# Patient Record
Sex: Female | Born: 1995
Health system: Southern US, Community
[De-identification: ages and names within clinical notes are randomized; demographics above are authoritative.]

## PROBLEM LIST (undated history)

## (undated) DIAGNOSIS — D689 Coagulation defect, unspecified: Secondary | ICD-10-CM

## (undated) DIAGNOSIS — R760 Raised antibody titer: Secondary | ICD-10-CM

## (undated) DIAGNOSIS — F419 Anxiety disorder, unspecified: Secondary | ICD-10-CM

## (undated) DIAGNOSIS — K8020 Calculus of gallbladder without cholecystitis without obstruction: Secondary | ICD-10-CM

## (undated) DIAGNOSIS — I2699 Other pulmonary embolism without acute cor pulmonale: Secondary | ICD-10-CM

## (undated) DIAGNOSIS — D6861 Antiphospholipid syndrome: Secondary | ICD-10-CM

## (undated) DIAGNOSIS — J45909 Unspecified asthma, uncomplicated: Secondary | ICD-10-CM

## (undated) HISTORY — DX: Anxiety disorder, unspecified: F41.9

## (undated) HISTORY — DX: Unspecified asthma, uncomplicated: J45.909

## (undated) HISTORY — PX: WISDOM TOOTH EXTRACTION: SHX21

---

## 2013-09-06 ENCOUNTER — Other Ambulatory Visit: Payer: Self-pay | Admitting: Advanced Practice Midwife

## 2013-09-06 DIAGNOSIS — Z30431 Encounter for routine checking of intrauterine contraceptive device: Secondary | ICD-10-CM

## 2013-09-08 ENCOUNTER — Ambulatory Visit (HOSPITAL_BASED_OUTPATIENT_CLINIC_OR_DEPARTMENT_OTHER): Payer: Self-pay

## 2015-08-26 DIAGNOSIS — R760 Raised antibody titer: Secondary | ICD-10-CM | POA: Insufficient documentation

## 2018-11-08 DIAGNOSIS — I2699 Other pulmonary embolism without acute cor pulmonale: Secondary | ICD-10-CM | POA: Insufficient documentation

## 2019-07-13 DIAGNOSIS — R519 Headache, unspecified: Secondary | ICD-10-CM | POA: Insufficient documentation

## 2019-11-06 ENCOUNTER — Encounter (HOSPITAL_COMMUNITY): Payer: Self-pay

## 2019-11-06 ENCOUNTER — Other Ambulatory Visit: Payer: Self-pay

## 2019-11-06 ENCOUNTER — Emergency Department (HOSPITAL_COMMUNITY)
Admission: EM | Admit: 2019-11-06 | Discharge: 2019-11-06 | Disposition: A | Discharge status: Home / Self Care | Payer: Medicaid Other | Attending: Emergency Medicine | Admitting: Emergency Medicine

## 2019-11-06 DIAGNOSIS — R112 Nausea with vomiting, unspecified: Secondary | ICD-10-CM | POA: Insufficient documentation

## 2019-11-06 DIAGNOSIS — R1011 Right upper quadrant pain: Secondary | ICD-10-CM | POA: Diagnosis not present

## 2019-11-06 DIAGNOSIS — R1013 Epigastric pain: Secondary | ICD-10-CM | POA: Diagnosis present

## 2019-11-06 DIAGNOSIS — K8050 Calculus of bile duct without cholangitis or cholecystitis without obstruction: Secondary | ICD-10-CM | POA: Diagnosis not present

## 2019-11-06 HISTORY — DX: Coagulation defect, unspecified: D68.9

## 2019-11-06 LAB — CBC
HCT: 42.5 % (ref 36.0–46.0)
Hemoglobin: 12.9 g/dL (ref 12.0–15.0)
MCH: 23 pg — ABNORMAL LOW (ref 26.0–34.0)
MCHC: 30.4 g/dL (ref 30.0–36.0)
MCV: 75.9 fL — ABNORMAL LOW (ref 80.0–100.0)
Platelets: 292 10*3/uL (ref 150–400)
RBC: 5.6 MIL/uL — ABNORMAL HIGH (ref 3.87–5.11)
RDW: 16.4 % — ABNORMAL HIGH (ref 11.5–15.5)
WBC: 8.5 10*3/uL (ref 4.0–10.5)
nRBC: 0 % (ref 0.0–0.2)

## 2019-11-06 LAB — BASIC METABOLIC PANEL
Anion gap: 9 (ref 5–15)
BUN: 15 mg/dL (ref 6–20)
CO2: 24 mmol/L (ref 22–32)
Calcium: 9.1 mg/dL (ref 8.9–10.3)
Chloride: 102 mmol/L (ref 98–111)
Creatinine, Ser: 0.81 mg/dL (ref 0.44–1.00)
GFR calc Af Amer: >60 mL/min (ref >60)
GFR calc non Af Amer: >60 mL/min (ref >60)
Glucose, Bld: 98 mg/dL (ref 70–99)
Potassium: 5 mmol/L (ref 3.5–5.1)
Sodium: 135 mmol/L (ref 135–145)

## 2019-11-06 LAB — HEPATIC FUNCTION PANEL
ALT: 21 U/L (ref 0–44)
AST: 40 U/L (ref 15–41)
Albumin: 4 g/dL (ref 3.5–5.0)
Alkaline Phosphatase: 63 U/L (ref 38–126)
Bilirubin, Direct: 0.5 mg/dL — ABNORMAL HIGH (ref 0.0–0.2)
Indirect Bilirubin: 0.5 mg/dL (ref 0.3–0.9)
Total Bilirubin: 1 mg/dL (ref 0.3–1.2)
Total Protein: 7.2 g/dL (ref 6.5–8.1)

## 2019-11-06 LAB — PROTIME-INR
INR: 1.1 (ref 0.8–1.2)
Prothrombin Time: 13.9 seconds (ref 11.4–15.2)

## 2019-11-06 LAB — I-STAT BETA HCG BLOOD, ED (MC, WL, AP ONLY): I-stat hCG, quantitative: <5 m[IU]/mL (ref <5)

## 2019-11-06 LAB — TROPONIN I (HIGH SENSITIVITY): Troponin I (High Sensitivity): <2 ng/L (ref <18)

## 2019-11-06 LAB — LIPASE, BLOOD: Lipase: 41 U/L (ref 11–51)

## 2019-11-06 MED ORDER — HYDROCODONE-ACETAMINOPHEN 5-325 MG PO TABS: 1.0000 | ORAL_TABLET | ORAL | 0 refills | Status: AC | PRN

## 2019-11-06 MED ORDER — ACETAMINOPHEN 500 MG PO TABS
1000.0000 mg | ORAL_TABLET | Freq: Once | ORAL | Status: AC
  Administered 2019-11-06: 06:00:00 1000 mg via ORAL
  Filled 2019-11-06: qty 2

## 2019-11-06 MED ORDER — ONDANSETRON 4 MG PO TBDP: ORAL_TABLET | ORAL | 0 refills | Status: DC

## 2019-11-06 NOTE — ED Provider Notes (Signed)
[facility] EMERGENCY DEPARTMENT Provider Note   CSN: [id number] Arrival date & time: 11/06/19  0346     History Chief Complaint  Patient presents with  . Abdominal Pain    Alicia Mendez is a 24 y.o. female.  Patient with history of clotting disorder, pulmonary embolism, on chronic Coumadin compliant presents with epigastric pain and right upper quadrant pain that radiates to the back.  No history of similar.  Last meal was cookout yesterday.  Patient does not recall normally getting pain after fatty meals.  No known gallbladder problems.  No fevers chills.  Patient did have vomiting.  No blood in the stools or ulcer history.        Past Medical History:  Diagnosis Date  . Clotting disorder (HCC)     There are no problems to display for this patient.   History reviewed. No pertinent surgical history.   OB History   No obstetric history on file.     History reviewed. No pertinent family history.  Social History   Tobacco Use  . Smoking status: Never Smoker  . Smokeless tobacco: Never Used  Substance Use Topics  . Alcohol use: Never  . Drug use: Never    Home Medications Prior to Admission medications   Medication Sig Start Date End Date Taking? Authorizing Provider  HYDROcodone-acetaminophen (NORCO) 5-325 MG tablet Take 1-2 tablets by mouth every 4 (four) hours as needed for up to 3 days for severe pain. 11/06/19 11/09/19  Blane Ohara, MD  ondansetron (ZOFRAN ODT) 4 MG disintegrating tablet 4mg  ODT q4 hours prn nausea/vomit 11/06/19   01/04/20, MD    Allergies    Patient has no known allergies.  Review of Systems   Review of Systems  Constitutional: Negative for chills and fever.  HENT: Negative for congestion.   Eyes: Negative for visual disturbance.  Respiratory: Negative for shortness of breath.   Cardiovascular: Negative for chest pain.  Gastrointestinal: Positive for abdominal pain, nausea and vomiting. Negative for  diarrhea.  Genitourinary: Negative for dysuria and flank pain.  Musculoskeletal: Negative for back pain, neck pain and neck stiffness.  Skin: Negative for rash.  Neurological: Negative for light-headedness and headaches.    Physical Exam Updated Vital Signs BP 135/84 (BP Location: Right Arm)   Pulse 74   Temp 97.8 F (36.6 C) (Oral)   Resp 17   Ht 5\' 3"  (1.6 m)   Wt 78.5 kg   SpO2 100%   BMI 30.65 kg/m   Physical Exam Vitals and nursing note reviewed.  Constitutional:      Appearance: She is well-developed.  HENT:     Head: Normocephalic and atraumatic.  Eyes:     General:        Right eye: No discharge.        Left eye: No discharge.     Conjunctiva/sclera: Conjunctivae normal.  Neck:     Trachea: No tracheal deviation.  Cardiovascular:     Rate and Rhythm: Normal rate and regular rhythm.  Pulmonary:     Effort: Pulmonary effort is normal.     Breath sounds: Normal breath sounds.  Abdominal:     General: There is no distension.     Palpations: Abdomen is soft.     Tenderness: There is abdominal tenderness in the right upper quadrant. There is no guarding.  Musculoskeletal:     Cervical back: Normal range of motion and neck supple.  Skin:    General: Skin is  warm.     Findings: No rash.  Neurological:     Mental Status: She is alert and oriented to person, place, and time.     ED Results / Procedures / Treatments   Labs (all labs ordered are listed, but only abnormal results are displayed) Labs Reviewed  CBC - Abnormal; Notable for the following components:      Result Value   RBC 5.60 (*)    MCV 75.9 (*)    MCH 23.0 (*)    RDW 16.4 (*)    All other components within normal limits  HEPATIC FUNCTION PANEL - Abnormal; Notable for the following components:   Bilirubin, Direct 0.5 (*)    All other components within normal limits  BASIC METABOLIC PANEL  PROTIME-INR  LIPASE, BLOOD  I-STAT BETA HCG BLOOD, ED ([facility], [facility], [facility] ONLY)  TROPONIN I (HIGH  SENSITIVITY)  TROPONIN I (HIGH SENSITIVITY)    EKG EKG Interpretation  Date/Time:  Monday November 06 2019 03:52:48 EST Ventricular Rate:  74 PR Interval:  130 QRS Duration: 78 QT Interval:  380 QTC Calculation: 421 R Axis:   79 Text Interpretation: Normal sinus rhythm with sinus arrhythmia Confirmed by Randal Buba, April ([id number]) on 11/06/2019 4:18:16 AM Also confirmed by Elnora Morrison [phone])  on 11/06/2019 5:23:42 AM   Radiology No results found.  Procedures Ultrasound ED Abd  Date/Time: 11/06/2019 6:11 AM Performed by: Elnora Morrison, MD Authorized by: Elnora Morrison, MD   Procedure details:    Indications: abdominal pain     Assessment for:  Gallstones   Hepatobiliary:  Visualized   Images: archived    Hepatobiliary findings:    Gallbladder wall:  Normal   Gallbladder stones: identified     Polyps: not identified     Sonographic Murphy's sign: negative     (including critical care time)  Medications Ordered in ED Medications  acetaminophen (TYLENOL) tablet 1,000 mg (1,000 mg Oral Given 11/06/19 0606)    ED Course  I have reviewed the triage vital signs and the nursing notes.  Pertinent labs & imaging results that were available during my care of the patient were reviewed by me and considered in my medical decision making (see chart for details).    MDM Rules/Calculators/A&P                      Patient presents with clinical concern for gallstones versus reflux.  Bedside ultrasound performed showing multiple shadowing gallstones and no signs of cholecystitis.  Patient is well-appearing currently, pain is mild, no active vomiting or fevers.  Discussed importance of follow-up with general surgery this week, avoiding fatty foods and reasons to return.  Blood work reviewed normal white blood cell count, Blood work reviewed normal liver function, normal lipase.  Patient's pain controlled and stable for outpatient follow-up with general surgery.  Final Clinical  Impression(s) / ED Diagnoses Final diagnoses:  Biliary colic    Rx / DC Orders ED Discharge Orders         Ordered    HYDROcodone-acetaminophen (NORCO) 5-325 MG tablet  Every 4 hours PRN     11/06/19 0605    ondansetron (ZOFRAN ODT) 4 MG disintegrating tablet     11/06/19 1856           Elnora Morrison, MD 11/06/19 [phone]

## 2019-11-06 NOTE — ED Notes (Signed)
ED Provider at bedside. 

## 2019-11-06 NOTE — ED Triage Notes (Signed)
Pt c/o epigastric pain that radiates to back. Also c/o nausea and vomiting.

## 2019-11-06 NOTE — Discharge Instructions (Signed)
Avoid fatty foods.  Use Tylenol every 4 hours as needed for mild pain.  For severe pain you can take Norco however it can make you sleepy and is addictive so be careful. If you develop uncontrolled pain, persistent vomiting, fevers or new concerns return to the emergency room.  If you do overall well follow-up closely with general surgery to discuss treatment options. Use Zofran as needed for vomiting. Your INR level is not therapeutic.  Please call your physician to clarify what level they want you out typically is between 2 and 3.  It is important for you to take your blood thinning medication at prevent blood clots.  The surgeon will talk to you about when to hold medication for surgery.

## 2019-11-19 ENCOUNTER — Encounter: Payer: Self-pay | Admitting: General Surgery

## 2019-11-19 ENCOUNTER — Other Ambulatory Visit: Payer: Self-pay | Admitting: General Surgery

## 2019-11-20 ENCOUNTER — Other Ambulatory Visit: Payer: Self-pay | Admitting: General Surgery

## 2019-11-23 ENCOUNTER — Other Ambulatory Visit: Payer: Self-pay | Admitting: General Surgery

## 2019-11-23 DIAGNOSIS — R1013 Epigastric pain: Secondary | ICD-10-CM

## 2019-11-29 ENCOUNTER — Ambulatory Visit
Admission: RE | Admit: 2019-11-29 | Discharge: 2019-11-29 | Disposition: A | Discharge status: Home / Self Care | Payer: Medicaid Other | Source: Ambulatory Visit | Attending: General Surgery | Admitting: General Surgery

## 2019-11-29 DIAGNOSIS — R1013 Epigastric pain: Secondary | ICD-10-CM

## 2019-12-07 ENCOUNTER — Encounter (HOSPITAL_COMMUNITY): Payer: Self-pay | Admitting: Emergency Medicine

## 2019-12-07 ENCOUNTER — Emergency Department (HOSPITAL_COMMUNITY)
Admission: EM | Admit: 2019-12-07 | Discharge: 2019-12-07 | Disposition: A | Discharge status: Home / Self Care | Payer: Medicaid Other | Attending: Emergency Medicine | Admitting: Emergency Medicine

## 2019-12-07 ENCOUNTER — Emergency Department (HOSPITAL_COMMUNITY): Payer: Medicaid Other

## 2019-12-07 ENCOUNTER — Other Ambulatory Visit: Payer: Self-pay

## 2019-12-07 DIAGNOSIS — R112 Nausea with vomiting, unspecified: Secondary | ICD-10-CM | POA: Diagnosis not present

## 2019-12-07 DIAGNOSIS — Z20822 Contact with and (suspected) exposure to covid-19: Secondary | ICD-10-CM | POA: Diagnosis not present

## 2019-12-07 DIAGNOSIS — K8020 Calculus of gallbladder without cholecystitis without obstruction: Secondary | ICD-10-CM

## 2019-12-07 DIAGNOSIS — R1011 Right upper quadrant pain: Secondary | ICD-10-CM | POA: Insufficient documentation

## 2019-12-07 HISTORY — DX: Calculus of gallbladder without cholecystitis without obstruction: K80.20

## 2019-12-07 LAB — RESPIRATORY PANEL BY RT PCR (FLU A&B, COVID)
Influenza A by PCR: NEGATIVE
Influenza B by PCR: NEGATIVE
SARS Coronavirus 2 by RT PCR: NEGATIVE

## 2019-12-07 LAB — LIPASE, BLOOD: Lipase: 55 U/L — ABNORMAL HIGH (ref 11–51)

## 2019-12-07 LAB — PROTIME-INR
INR: 2.1 — ABNORMAL HIGH (ref 0.8–1.2)
Prothrombin Time: 23.9 seconds — ABNORMAL HIGH (ref 11.4–15.2)

## 2019-12-07 LAB — COMPREHENSIVE METABOLIC PANEL
ALT: 18 U/L (ref 0–44)
AST: 18 U/L (ref 15–41)
Albumin: 4.2 g/dL (ref 3.5–5.0)
Alkaline Phosphatase: 68 U/L (ref 38–126)
Anion gap: 11 (ref 5–15)
BUN: 20 mg/dL (ref 6–20)
CO2: 26 mmol/L (ref 22–32)
Calcium: 9.2 mg/dL (ref 8.9–10.3)
Chloride: 100 mmol/L (ref 98–111)
Creatinine, Ser: 0.73 mg/dL (ref 0.44–1.00)
GFR calc Af Amer: >60 mL/min (ref >60)
GFR calc non Af Amer: >60 mL/min (ref >60)
Glucose, Bld: 101 mg/dL — ABNORMAL HIGH (ref 70–99)
Potassium: 3.7 mmol/L (ref 3.5–5.1)
Sodium: 137 mmol/L (ref 135–145)
Total Bilirubin: 0.3 mg/dL (ref 0.3–1.2)
Total Protein: 7.7 g/dL (ref 6.5–8.1)

## 2019-12-07 LAB — URINALYSIS, ROUTINE W REFLEX MICROSCOPIC
Bacteria, UA: NONE SEEN
Bilirubin Urine: NEGATIVE
Glucose, UA: NEGATIVE mg/dL
Ketones, ur: NEGATIVE mg/dL
Nitrite: NEGATIVE
Protein, ur: NEGATIVE mg/dL
Specific Gravity, Urine: 1.023 (ref 1.005–1.030)
pH: 8 (ref 5.0–8.0)

## 2019-12-07 LAB — CBC
HCT: 42.6 % (ref 36.0–46.0)
Hemoglobin: 13 g/dL (ref 12.0–15.0)
MCH: 23.3 pg — ABNORMAL LOW (ref 26.0–34.0)
MCHC: 30.5 g/dL (ref 30.0–36.0)
MCV: 76.2 fL — ABNORMAL LOW (ref 80.0–100.0)
Platelets: 295 10*3/uL (ref 150–400)
RBC: 5.59 MIL/uL — ABNORMAL HIGH (ref 3.87–5.11)
RDW: 16.7 % — ABNORMAL HIGH (ref 11.5–15.5)
WBC: 9.8 10*3/uL (ref 4.0–10.5)
nRBC: 0 % (ref 0.0–0.2)

## 2019-12-07 LAB — I-STAT BETA HCG BLOOD, ED (MC, WL, AP ONLY): I-stat hCG, quantitative: <5 m[IU]/mL (ref <5)

## 2019-12-07 MED ORDER — ONDANSETRON HCL 4 MG/2ML IJ SOLN
4.0000 mg | Freq: Once | INTRAMUSCULAR | Status: AC
  Administered 2019-12-07: 4 mg via INTRAVENOUS
  Filled 2019-12-07: qty 2

## 2019-12-07 MED ORDER — AMOXICILLIN-POT CLAVULANATE 875-125 MG PO TABS: 1.0000 | ORAL_TABLET | Freq: Two times a day (BID) | ORAL | 0 refills | Status: DC

## 2019-12-07 MED ORDER — SODIUM CHLORIDE 0.9% FLUSH: 3.0000 mL | Freq: Once | INTRAVENOUS | Status: DC

## 2019-12-07 MED ORDER — FENTANYL CITRATE (PF) 100 MCG/2ML IJ SOLN
50.0000 ug | Freq: Once | INTRAMUSCULAR | Status: AC
  Administered 2019-12-07: 50 ug via INTRAVENOUS
  Filled 2019-12-07: qty 2

## 2019-12-07 NOTE — ED Triage Notes (Signed)
Patient reports upper abdominal pain with emesis and diarrhea onset this week , denies fever or chills , patient stated it feels like her gallstone pain .

## 2019-12-07 NOTE — ED Notes (Signed)
The pt has known gallstones  She awakened at 0100am  With pain and vomited once   Her pain is not going away  She has a clotting disorder  She has to be cleared by her oncologist  And she says that her surgery will be posted after this  She takes coumadin

## 2019-12-07 NOTE — Discharge Instructions (Addendum)
Contact a health care provider if: °You think you have had a gallbladder attack. °You have been diagnosed with silent gallstones and you develop abdominal pain or indigestion. °Get help right away if: °You have pain from a gallbladder attack that lasts for more than 2 hours. °You have abdominal pain that lasts for more than 5 hours. °You have a fever or chills. °You have persistent nausea and vomiting. °You develop jaundice. °You have dark urine or light-colored stools. °

## 2019-12-07 NOTE — Consult Note (Signed)
Reason for Consult: abdominal pain Referring Physician: April Palumbo  Alicia Mendez is an 24 y.o. female.  HPI: 24 yo female with 1 month of intermittent abdominal pain. She woke up from sleep with pain and vomiting. She has intermittent diarrhea. She denies fevers. She was set to see her hematologist tomorrow  Past Medical History:  Diagnosis Date  . Clotting disorder (HCC)   . Gallstones     History reviewed. No pertinent surgical history.  No family history on file.  Social History:  reports that she has never smoked. She has never used smokeless tobacco. She reports that she does not drink alcohol or use drugs.  Allergies: No Known Allergies  Medications: I have reviewed the patient's current medications.  Results for orders placed or performed during the hospital encounter of 12/07/19 (from the past 48 hour(s))  Lipase, blood     Status: Abnormal   Collection Time: 12/07/19  2:57 AM  Result Value Ref Range   Lipase 55 (H) 11 - 51 U/L    Comment: Performed at [facility], [address], [place], [place] [address]  Comprehensive metabolic panel     Status: Abnormal   Collection Time: 12/07/19  2:57 AM  Result Value Ref Range   Sodium 137 135 - 145 mmol/L   Potassium 3.7 3.5 - 5.1 mmol/L   Chloride 100 98 - 111 mmol/L   CO2 26 22 - 32 mmol/L   Glucose, Bld 101 (H) 70 - 99 mg/dL   BUN 20 6 - 20 mg/dL   Creatinine, Ser 2.37 0.44 - 1.00 mg/dL   Calcium 9.2 8.9 - 62.8 mg/dL   Total Protein 7.7 6.5 - 8.1 g/dL   Albumin 4.2 3.5 - 5.0 g/dL   AST 18 15 - 41 U/L   ALT 18 0 - 44 U/L   Alkaline Phosphatase 68 38 - 126 U/L   Total Bilirubin 0.3 0.3 - 1.2 mg/dL   GFR calc non Af Amer >60 >60 mL/min   GFR calc Af Amer >60 >60 mL/min   Anion gap 11 5 - 15    Comment: Performed at [facility], [address], [place], [place] [address]  CBC     Status: Abnormal   Collection Time: 12/07/19  2:57 AM  Result Value Ref Range   WBC 9.8 4.0 - 10.5 K/uL   RBC  5.59 (H) 3.87 - 5.11 MIL/uL   Hemoglobin 13.0 12.0 - 15.0 g/dL   HCT 61.6 07.3 - 71.0 %   MCV 76.2 (L) 80.0 - 100.0 fL   MCH 23.3 (L) 26.0 - 34.0 pg   MCHC 30.5 30.0 - 36.0 g/dL   RDW 62.6 (H) 94.8 - 54.6 %   Platelets 295 150 - 400 K/uL   nRBC 0.0 0.0 - 0.2 %    Comment: Performed at [facility], [address] [place], [place] [address]  Urinalysis, Routine w reflex microscopic     Status: Abnormal   Collection Time: 12/07/19  2:59 AM  Result Value Ref Range   Color, Urine YELLOW YELLOW   APPearance HAZY (A) CLEAR   Specific Gravity, Urine 1.023 1.005 - 1.030   pH 8.0 5.0 - 8.0   Glucose, UA NEGATIVE NEGATIVE mg/dL   Hgb urine dipstick MODERATE (A) NEGATIVE   Bilirubin Urine NEGATIVE NEGATIVE   Ketones, ur NEGATIVE NEGATIVE mg/dL   Protein, ur NEGATIVE NEGATIVE mg/dL   Nitrite NEGATIVE NEGATIVE   Leukocytes,Ua LARGE (A)  NEGATIVE   RBC / HPF 0-5 0 - 5 RBC/hpf   WBC, UA 6-10 0 - 5 WBC/hpf   Bacteria, UA NONE SEEN NONE SEEN   Squamous Epithelial / LPF 11-20 0 - 5   Mucus PRESENT     Comment: Performed at [facility], [address], [place], [place] [address]  I-Stat beta hCG blood, ED     Status: None   Collection Time: 12/07/19  3:03 AM  Result Value Ref Range   I-stat hCG, quantitative <5.0 <5 mIU/mL   Comment 3            Comment:   GEST. AGE      CONC.  (mIU/mL)   <=1 WEEK        5 - 50     2 WEEKS       50 - 500     3 WEEKS       100 - 10,000     4 WEEKS     1,000 - 30,000        FEMALE AND NON-PREGNANT FEMALE:     LESS THAN 5 mIU/mL   Respiratory Panel by RT PCR (Flu A&B, Covid) - Nasopharyngeal Swab     Status: None   Collection Time: 12/07/19  4:15 AM   Specimen: Nasopharyngeal Swab  Result Value Ref Range   SARS Coronavirus 2 by RT PCR NEGATIVE NEGATIVE    Comment: (NOTE) SARS-CoV-2 target nucleic acids are NOT DETECTED. The SARS-CoV-2 RNA is generally detectable in upper respiratoy specimens during the acute phase of infection. The  lowest concentration of SARS-CoV-2 viral copies this assay can detect is 131 copies/mL. A negative result does not preclude SARS-Cov-2 infection and should not be used as the sole basis for treatment or other patient management decisions. A negative result may occur with  improper specimen collection/handling, submission of specimen other than nasopharyngeal swab, presence of viral mutation(s) within the areas targeted by this assay, and inadequate number of viral copies (<131 copies/mL). A negative result must be combined with clinical observations, patient history, and epidemiological information. The expected result is Negative. Fact Sheet for Patients:  https://www.moore.com/ Fact Sheet for Healthcare Providers:  https://www.young.biz/ This test is not yet ap proved or cleared by the [place] FDA and  has been authorized for detection and/or diagnosis of SARS-CoV-2 by FDA under an Emergency Use Authorization (EUA). This EUA will remain  in effect (meaning this test can be used) for the duration of the COVID-19 declaration under Section 564(b)(1) of the Act, 21 U.S.C. section 360bbb-3(b)(1), unless the authorization is terminated or revoked sooner.    Influenza A by PCR NEGATIVE NEGATIVE   Influenza B by PCR NEGATIVE NEGATIVE    Comment: (NOTE) The Xpert Xpress SARS-CoV-2/FLU/RSV assay is intended as an aid in  the diagnosis of influenza from Nasopharyngeal swab specimens and  should not be used as a sole basis for treatment. Nasal washings and  aspirates are unacceptable for Xpert Xpress SARS-CoV-2/FLU/RSV  testing. Fact Sheet for Patients: https://www.moore.com/ Fact Sheet for Healthcare Providers: https://www.young.biz/ This test is not yet approved or cleared by the [place] FDA and  has been authorized for detection and/or diagnosis of SARS-CoV-2 by  FDA under an Emergency Use  Authorization (EUA). This EUA will remain  in effect (meaning this test can be used) for the duration of the  Covid-19 declaration under Section 564(b)(1) of the Act, 21  U.S.C. section 360bbb-3(b)(1), unless the authorization is  terminated  or revoked. Performed at [facility], [place] [address], [place], [place] [address]   Protime-INR     Status: Abnormal   Collection Time: 12/07/19  5:41 AM  Result Value Ref Range   Prothrombin Time 23.9 (H) 11.4 - 15.2 seconds   INR 2.1 (H) 0.8 - 1.2    Comment: (NOTE) INR goal varies based on device and disease states. Performed at [facility], [place] [address] [place], [place] [address]     US ABDOMEN LIMITED RUQ  Result Date: 12/07/2019 CLINICAL DATA:  Right upper quadrant pain EXAM: ULTRASOUND ABDOMEN LIMITED RIGHT UPPER QUADRANT COMPARISON:  11/29/2019 FINDINGS: Gallbladder: Multiple layering calcified stones are seen. The largest measuring 6 mm. The gallbladder wall measures 2.9 mm. No sonographic Murphy's or pericholecystic fluid. Common bile duct: Diameter: 3.6 mm Liver: No focal lesion identified. Within normal limits in parenchymal echogenicity. Portal vein is patent on color Doppler imaging with normal direction of blood flow towards the liver. Other: None. IMPRESSION: Cholelithiasis without evidence of cholecystitis. Electronically Signed   By: Prudencio Pair M.D.   On: 12/07/2019 03:51   Review of Systems  Constitutional: Negative for chills and fever.  HENT: Negative for hearing loss.   Eyes: Negative for blurred vision and double vision.  Respiratory: Negative for cough and hemoptysis.   Cardiovascular: Negative for chest pain and palpitations.  Gastrointestinal: Positive for abdominal pain, nausea and vomiting.  Genitourinary: Negative for dysuria and urgency.  Musculoskeletal: Negative for myalgias and neck pain.  Skin: Negative for itching and rash.  Neurological: Negative for dizziness, tingling and headaches.   Endo/Heme/Allergies: Does not bruise/bleed easily.  Psychiatric/Behavioral: Negative for depression and suicidal ideas.   Blood pressure (!) 134/95, pulse 85, temperature (!) 97.5 F (36.4 C), temperature source Oral, resp. rate 16, last menstrual period 11/14/2019, SpO2 100 %. Physical Exam  Vitals reviewed. Constitutional: She is oriented to person, place, and time. She appears well-developed and well-nourished.  HENT:  Head: Normocephalic and atraumatic.  Eyes: Pupils are equal, round, and reactive to light. Conjunctivae and EOM are normal.  Cardiovascular: Normal rate and regular rhythm.  Respiratory: Effort normal and breath sounds normal.  GI: Soft. Bowel sounds are normal. She exhibits no distension. There is no abdominal tenderness.  Musculoskeletal:        General: Normal range of motion.     Cervical back: Normal range of motion and neck supple.  Neurological: She is alert and oriented to person, place, and time.  Skin: Skin is warm and dry.  Psychiatric: She has a normal mood and affect. Her behavior is normal.   Assessment/Plan: 24 yo female with gallstones who had an attack. Labs are normal, INR is 2.1 -patient would prefer to go home today -will send with 1 week of antibiotics  Arta Bruce Maanav Kassabian 12/07/2019, 6:29 AM

## 2019-12-07 NOTE — ED Provider Notes (Signed)
[facility] EMERGENCY DEPARTMENT Provider Note   CSN: [id number] Arrival date & time: 12/07/19  0243     History Chief Complaint  Patient presents with  . Abdominal Pain    Gallstones    Alicia Mendez is a 24 y.o. female.  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  Patient has had recurrent episodes of biliary colic secondary to gallstones.  She states that she woke from sleep at 1 AM with progressively worsening colicky right upper quadrant pain and epigastric pain consistent with her previous episodes of biliary colic.  It has been severe, worsening.  She has had at least one episode of vomiting.  The pain radiates to her right upper back region.  She denies any urinary symptoms.  She has current nausea and severe pain.  She denies urinary symptoms, vaginal symptoms, diarrhea.  HPI     Past Medical History:  Diagnosis Date  . Clotting disorder ([place])   . Gallstones     Patient Active Problem List   Diagnosis Date Noted  . Headache disorder 07/13/2019  . 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 No pertinent surgical history.   OB History   No obstetric history on file.     No family history on file.  Social History   Tobacco Use  . Smoking status: Never Smoker  . Smokeless tobacco: Never Used  Substance Use Topics  . Alcohol use: Never  . Drug use: Never    Home Medications Prior to Admission medications   Medication Sig Start Date End Date Taking? Authorizing Provider  ondansetron (ZOFRAN ODT) 4 MG disintegrating tablet 4mg  ODT q4 hours prn nausea/vomit 11/06/19   Elnora Morrison, MD    Allergies    Patient has no known allergies.  Review of Systems   Review of Systems Ten systems reviewed and are negative for acute change, except as noted in the HPI.   Physical  Exam Updated Vital Signs BP (!) 134/95 (BP Location: Left Arm)   Pulse 85   Temp (!) 97.5 F (36.4 C) (Oral)   Resp 16   LMP 11/14/2019   SpO2 100%   Physical Exam Vitals and nursing note reviewed.  Constitutional:      General: She is not in acute distress.    Appearance: She is well-developed. She is not diaphoretic.  HENT:     Head: Normocephalic and atraumatic.  Eyes:     General: No scleral icterus.    Conjunctiva/sclera: Conjunctivae normal.  Cardiovascular:     Rate and Rhythm: Normal rate and regular rhythm.     Heart sounds: Normal heart sounds. No murmur. No friction rub. No gallop.   Pulmonary:     Effort: Pulmonary effort is normal. No respiratory distress.     Breath sounds: Normal breath sounds.  Abdominal:     General: Bowel sounds are normal. There is no distension.     Palpations: Abdomen is soft. There is no mass.     Tenderness: There is abdominal tenderness in the right upper quadrant. There is guarding. There is no right CVA tenderness or left CVA tenderness. Positive signs include Murphy's sign.  Musculoskeletal:     Cervical back: Normal range of motion.  Skin:    General: Skin is warm and dry.  Neurological:     Mental Status: She is alert and oriented to person, place, and  time.  Psychiatric:        Behavior: Behavior normal.     ED Results / Procedures / Treatments   Labs (all labs ordered are listed, but only abnormal results are displayed) Labs Reviewed  URINALYSIS, ROUTINE W REFLEX MICROSCOPIC - Abnormal; Notable for the following components:      Result Value   APPearance HAZY (*)    Hgb urine dipstick MODERATE (*)    Leukocytes,Ua LARGE (*)    All other components within normal limits  LIPASE, BLOOD  COMPREHENSIVE METABOLIC PANEL  CBC  I-STAT BETA HCG BLOOD, ED ([facility], [facility], [facility] ONLY)    EKG None  Radiology No results found.  Procedures Procedures (including critical care time)  Medications Ordered in ED Medications  sodium  chloride flush (NS) 0.9 % injection 3 mL (has no administration in time range)    ED Course  I have reviewed the triage vital signs and the nursing notes.  Pertinent labs & imaging results that were available during my care of the patient were reviewed by me and considered in my medical decision making (see chart for details).  Clinical Course as of Dec 06 721  Thu Dec 07, 2019  0534 Patient on Coumadin- INR pending    [AH]    Clinical Course User Index [AH] Arthor Captain, PA-C   MDM Rules/Calculators/A&P                     24 year old female here with known gallstones and history of biliary colic.The emergent DDX for RUQ pain includes but is not limited to Glabladder disease, PUD, Acute Hepatitis, Pancreatitis, pyelonephritis, Pneumonia, Lower lobe PE/Infarct, Kidney stone, GERD, retrocecal appendicitis, Fitz-Hugh-Curtis syndrome, AAA, MI, Zoster. I reviewed the patient's labs which show therapeutic INR, negative pregnancy test, negative Covid and flu panel, urine appears contaminated.  Her CMP shows a mildly elevated glucose in setting of pain likely reactive.  CBC without significant abnormality.  Lipase is just above normal and likely of insignificant value.  Patient continued to have pain although improved after pain medications.  She was evaluated in the ER by Dr. Sheliah Hatch however patient declined surgery at this time she needs a line of childcare and will be discharged on Augmentin per Dr. Sheliah Hatch.  Discussed return precautions  Final Clinical Impression(s) / ED Diagnoses Final diagnoses:  RUQ abdominal pain    Rx / DC Orders ED Discharge Orders    None       Arthor Captain, PA-C 12/07/19 0726    Palumbo, April, MD 12/07/19 2309

## 2020-04-25 DIAGNOSIS — Z419 Encounter for procedure for purposes other than remedying health state, unspecified: Secondary | ICD-10-CM | POA: Diagnosis not present

## 2020-05-03 DIAGNOSIS — I2699 Other pulmonary embolism without acute cor pulmonale: Secondary | ICD-10-CM | POA: Diagnosis not present

## 2020-05-03 DIAGNOSIS — D6861 Antiphospholipid syndrome: Secondary | ICD-10-CM | POA: Diagnosis not present

## 2020-05-10 DIAGNOSIS — D6861 Antiphospholipid syndrome: Secondary | ICD-10-CM | POA: Diagnosis not present

## 2020-05-10 DIAGNOSIS — I2699 Other pulmonary embolism without acute cor pulmonale: Secondary | ICD-10-CM | POA: Diagnosis not present

## 2020-05-16 DIAGNOSIS — Z3202 Encounter for pregnancy test, result negative: Secondary | ICD-10-CM | POA: Diagnosis not present

## 2020-05-16 DIAGNOSIS — N912 Amenorrhea, unspecified: Secondary | ICD-10-CM | POA: Diagnosis not present

## 2020-05-16 DIAGNOSIS — Z975 Presence of (intrauterine) contraceptive device: Secondary | ICD-10-CM | POA: Diagnosis not present

## 2020-05-16 DIAGNOSIS — Z3481 Encounter for supervision of other normal pregnancy, first trimester: Secondary | ICD-10-CM | POA: Diagnosis not present

## 2020-05-20 DIAGNOSIS — I2699 Other pulmonary embolism without acute cor pulmonale: Secondary | ICD-10-CM | POA: Diagnosis not present

## 2020-05-21 DIAGNOSIS — N912 Amenorrhea, unspecified: Secondary | ICD-10-CM | POA: Diagnosis not present

## 2020-05-21 DIAGNOSIS — Z3202 Encounter for pregnancy test, result negative: Secondary | ICD-10-CM | POA: Diagnosis not present

## 2020-05-26 DIAGNOSIS — Z419 Encounter for procedure for purposes other than remedying health state, unspecified: Secondary | ICD-10-CM | POA: Diagnosis not present

## 2020-06-11 DIAGNOSIS — Z30430 Encounter for insertion of intrauterine contraceptive device: Secondary | ICD-10-CM | POA: Diagnosis not present

## 2020-06-11 DIAGNOSIS — N912 Amenorrhea, unspecified: Secondary | ICD-10-CM | POA: Diagnosis not present

## 2020-06-11 DIAGNOSIS — Z3202 Encounter for pregnancy test, result negative: Secondary | ICD-10-CM | POA: Diagnosis not present

## 2020-06-18 DIAGNOSIS — D6859 Other primary thrombophilia: Secondary | ICD-10-CM | POA: Diagnosis not present

## 2020-06-18 DIAGNOSIS — D6861 Antiphospholipid syndrome: Secondary | ICD-10-CM | POA: Diagnosis not present

## 2020-06-18 DIAGNOSIS — I2699 Other pulmonary embolism without acute cor pulmonale: Secondary | ICD-10-CM | POA: Diagnosis not present

## 2020-06-21 ENCOUNTER — Emergency Department (HOSPITAL_COMMUNITY): Payer: Medicaid Other

## 2020-06-21 ENCOUNTER — Emergency Department (HOSPITAL_COMMUNITY)
Admission: EM | Admit: 2020-06-21 | Discharge: 2020-06-22 | Disposition: A | Discharge status: Home / Self Care | Payer: Medicaid Other | Attending: Emergency Medicine | Admitting: Emergency Medicine

## 2020-06-21 ENCOUNTER — Other Ambulatory Visit: Payer: Self-pay

## 2020-06-21 ENCOUNTER — Encounter (HOSPITAL_COMMUNITY): Payer: Self-pay

## 2020-06-21 DIAGNOSIS — Y998 Other external cause status: Secondary | ICD-10-CM | POA: Diagnosis not present

## 2020-06-21 DIAGNOSIS — Z7901 Long term (current) use of anticoagulants: Secondary | ICD-10-CM | POA: Diagnosis not present

## 2020-06-21 DIAGNOSIS — Y9289 Other specified places as the place of occurrence of the external cause: Secondary | ICD-10-CM | POA: Insufficient documentation

## 2020-06-21 DIAGNOSIS — M542 Cervicalgia: Secondary | ICD-10-CM | POA: Insufficient documentation

## 2020-06-21 DIAGNOSIS — M545 Low back pain: Secondary | ICD-10-CM | POA: Diagnosis not present

## 2020-06-21 DIAGNOSIS — Y9389 Activity, other specified: Secondary | ICD-10-CM | POA: Diagnosis not present

## 2020-06-21 DIAGNOSIS — R519 Headache, unspecified: Secondary | ICD-10-CM | POA: Diagnosis not present

## 2020-06-21 DIAGNOSIS — F4310 Post-traumatic stress disorder, unspecified: Secondary | ICD-10-CM | POA: Diagnosis not present

## 2020-06-21 LAB — COMPREHENSIVE METABOLIC PANEL
ALT: 17 U/L (ref 0–44)
AST: 20 U/L (ref 15–41)
Albumin: 4.1 g/dL (ref 3.5–5.0)
Alkaline Phosphatase: 60 U/L (ref 38–126)
Anion gap: 11 (ref 5–15)
BUN: 8 mg/dL (ref 6–20)
CO2: 23 mmol/L (ref 22–32)
Calcium: 9.1 mg/dL (ref 8.9–10.3)
Chloride: 103 mmol/L (ref 98–111)
Creatinine, Ser: 1.09 mg/dL — ABNORMAL HIGH (ref 0.44–1.00)
GFR calc Af Amer: >60 mL/min (ref >60)
GFR calc non Af Amer: >60 mL/min (ref >60)
Glucose, Bld: 101 mg/dL — ABNORMAL HIGH (ref 70–99)
Potassium: 3.9 mmol/L (ref 3.5–5.1)
Sodium: 137 mmol/L (ref 135–145)
Total Bilirubin: 0.4 mg/dL (ref 0.3–1.2)
Total Protein: 7.6 g/dL (ref 6.5–8.1)

## 2020-06-21 LAB — CBC WITH DIFFERENTIAL/PLATELET
Abs Immature Granulocytes: 0.02 10*3/uL (ref 0.00–0.07)
Basophils Absolute: 0 10*3/uL (ref 0.0–0.1)
Basophils Relative: 0 %
Eosinophils Absolute: 0.1 10*3/uL (ref 0.0–0.5)
Eosinophils Relative: 1 %
HCT: 43.4 % (ref 36.0–46.0)
Hemoglobin: 13.6 g/dL (ref 12.0–15.0)
Immature Granulocytes: 0 %
Lymphocytes Relative: 38 %
Lymphs Abs: 3.1 10*3/uL (ref 0.7–4.0)
MCH: 25.2 pg — ABNORMAL LOW (ref 26.0–34.0)
MCHC: 31.3 g/dL (ref 30.0–36.0)
MCV: 80.5 fL (ref 80.0–100.0)
Monocytes Absolute: 0.5 10*3/uL (ref 0.1–1.0)
Monocytes Relative: 6 %
Neutro Abs: 4.5 10*3/uL (ref 1.7–7.7)
Neutrophils Relative %: 55 %
Platelets: 296 10*3/uL (ref 150–400)
RBC: 5.39 MIL/uL — ABNORMAL HIGH (ref 3.87–5.11)
RDW: 15.1 % (ref 11.5–15.5)
WBC: 8.3 10*3/uL (ref 4.0–10.5)
nRBC: 0 % (ref 0.0–0.2)

## 2020-06-21 LAB — I-STAT BETA HCG BLOOD, ED (MC, WL, AP ONLY): I-stat hCG, quantitative: <5 m[IU]/mL (ref <5)

## 2020-06-21 LAB — PROTIME-INR
INR: 1.7 — ABNORMAL HIGH (ref 0.8–1.2)
Prothrombin Time: 19.2 seconds — ABNORMAL HIGH (ref 11.4–15.2)
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
## 2020-06-22 ENCOUNTER — Encounter (HOSPITAL_COMMUNITY): Payer: Self-pay | Admitting: Emergency Medicine

## 2020-06-22 MED ORDER — OXYCODONE-ACETAMINOPHEN 5-325 MG PO TABS
2.0000 | ORAL_TABLET | Freq: Once | ORAL | Status: AC
  Administered 2020-06-22: 2 via ORAL
  Filled 2020-06-22: qty 2

## 2020-06-22 MED ORDER — IBUPROFEN 400 MG PO TABS
400.0000 mg | ORAL_TABLET | Freq: Once | ORAL | Status: AC
  Administered 2020-06-22: 400 mg via ORAL
  Filled 2020-06-22: qty 1
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
## 2020-06-22 NOTE — Discharge Instructions (Addendum)
Please call your doctor for a therapist referral Return if your headache worsens or you begin having weakness one side or the other.

## 2020-06-26 DIAGNOSIS — Z419 Encounter for procedure for purposes other than remedying health state, unspecified: Secondary | ICD-10-CM | POA: Diagnosis not present

## 2020-06-28 DIAGNOSIS — I2699 Other pulmonary embolism without acute cor pulmonale: Secondary | ICD-10-CM | POA: Diagnosis not present

## 2020-06-28 DIAGNOSIS — D6861 Antiphospholipid syndrome: Secondary | ICD-10-CM | POA: Diagnosis not present

## 2020-07-05 DIAGNOSIS — F4321 Adjustment disorder with depressed mood: Secondary | ICD-10-CM | POA: Diagnosis not present

## 2020-07-05 DIAGNOSIS — I2699 Other pulmonary embolism without acute cor pulmonale: Secondary | ICD-10-CM | POA: Diagnosis not present

## 2020-07-05 DIAGNOSIS — D6861 Antiphospholipid syndrome: Secondary | ICD-10-CM | POA: Diagnosis not present

## 2020-07-12 DIAGNOSIS — I2699 Other pulmonary embolism without acute cor pulmonale: Secondary | ICD-10-CM | POA: Diagnosis not present

## 2020-07-12 DIAGNOSIS — D6861 Antiphospholipid syndrome: Secondary | ICD-10-CM | POA: Diagnosis not present

## 2020-07-12 DIAGNOSIS — F4321 Adjustment disorder with depressed mood: Secondary | ICD-10-CM | POA: Diagnosis not present

## 2020-07-19 DIAGNOSIS — F4321 Adjustment disorder with depressed mood: Secondary | ICD-10-CM | POA: Diagnosis not present

## 2020-07-26 DIAGNOSIS — Z419 Encounter for procedure for purposes other than remedying health state, unspecified: Secondary | ICD-10-CM | POA: Diagnosis not present

## 2020-08-02 DIAGNOSIS — D6861 Antiphospholipid syndrome: Secondary | ICD-10-CM | POA: Diagnosis not present

## 2020-08-02 DIAGNOSIS — I2699 Other pulmonary embolism without acute cor pulmonale: Secondary | ICD-10-CM | POA: Diagnosis not present

## 2020-08-23 DIAGNOSIS — D6859 Other primary thrombophilia: Secondary | ICD-10-CM | POA: Diagnosis not present

## 2020-08-23 DIAGNOSIS — D6861 Antiphospholipid syndrome: Secondary | ICD-10-CM | POA: Diagnosis not present

## 2020-08-23 DIAGNOSIS — I2699 Other pulmonary embolism without acute cor pulmonale: Secondary | ICD-10-CM | POA: Diagnosis not present

## 2020-08-26 DIAGNOSIS — Z419 Encounter for procedure for purposes other than remedying health state, unspecified: Secondary | ICD-10-CM | POA: Diagnosis not present

## 2020-09-05 DIAGNOSIS — I2699 Other pulmonary embolism without acute cor pulmonale: Secondary | ICD-10-CM | POA: Diagnosis not present

## 2020-09-05 DIAGNOSIS — D6861 Antiphospholipid syndrome: Secondary | ICD-10-CM | POA: Diagnosis not present

## 2020-09-05 DIAGNOSIS — D6859 Other primary thrombophilia: Secondary | ICD-10-CM | POA: Diagnosis not present

## 2020-09-13 DIAGNOSIS — G43909 Migraine, unspecified, not intractable, without status migrainosus: Secondary | ICD-10-CM | POA: Diagnosis not present

## 2020-09-13 DIAGNOSIS — F4310 Post-traumatic stress disorder, unspecified: Secondary | ICD-10-CM | POA: Diagnosis not present

## 2020-09-13 DIAGNOSIS — Z20822 Contact with and (suspected) exposure to covid-19: Secondary | ICD-10-CM | POA: Diagnosis not present

## 2020-09-13 DIAGNOSIS — R0981 Nasal congestion: Secondary | ICD-10-CM | POA: Diagnosis not present
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
## 2020-09-25 DIAGNOSIS — Z419 Encounter for procedure for purposes other than remedying health state, unspecified: Secondary | ICD-10-CM | POA: Diagnosis not present

## 2020-09-27 DIAGNOSIS — I2699 Other pulmonary embolism without acute cor pulmonale: Secondary | ICD-10-CM | POA: Diagnosis not present

## 2020-09-27 DIAGNOSIS — D6859 Other primary thrombophilia: Secondary | ICD-10-CM | POA: Diagnosis not present

## 2020-09-27 DIAGNOSIS — D6861 Antiphospholipid syndrome: Secondary | ICD-10-CM | POA: Diagnosis not present

## 2020-10-04 DIAGNOSIS — D6861 Antiphospholipid syndrome: Secondary | ICD-10-CM | POA: Diagnosis not present

## 2020-10-04 DIAGNOSIS — I2699 Other pulmonary embolism without acute cor pulmonale: Secondary | ICD-10-CM | POA: Diagnosis not present

## 2020-10-11 DIAGNOSIS — D6861 Antiphospholipid syndrome: Secondary | ICD-10-CM | POA: Diagnosis not present

## 2020-10-11 DIAGNOSIS — I2699 Other pulmonary embolism without acute cor pulmonale: Secondary | ICD-10-CM | POA: Diagnosis not present

## 2020-10-24 DIAGNOSIS — D6859 Other primary thrombophilia: Secondary | ICD-10-CM | POA: Diagnosis not present

## 2020-10-24 DIAGNOSIS — I2699 Other pulmonary embolism without acute cor pulmonale: Secondary | ICD-10-CM | POA: Diagnosis not present

## 2020-10-24 DIAGNOSIS — D6861 Antiphospholipid syndrome: Secondary | ICD-10-CM | POA: Diagnosis not present

## 2020-10-26 DIAGNOSIS — Z419 Encounter for procedure for purposes other than remedying health state, unspecified: Secondary | ICD-10-CM | POA: Diagnosis not present

## 2020-11-08 DIAGNOSIS — D6861 Antiphospholipid syndrome: Secondary | ICD-10-CM | POA: Diagnosis not present

## 2020-11-08 DIAGNOSIS — I2699 Other pulmonary embolism without acute cor pulmonale: Secondary | ICD-10-CM | POA: Diagnosis not present

## 2020-11-08 DIAGNOSIS — Z5181 Encounter for therapeutic drug level monitoring: Secondary | ICD-10-CM | POA: Diagnosis not present

## 2020-11-22 DIAGNOSIS — I2699 Other pulmonary embolism without acute cor pulmonale: Secondary | ICD-10-CM | POA: Diagnosis not present

## 2020-11-22 DIAGNOSIS — D6861 Antiphospholipid syndrome: Secondary | ICD-10-CM | POA: Diagnosis not present

## 2020-11-22 DIAGNOSIS — D6859 Other primary thrombophilia: Secondary | ICD-10-CM | POA: Diagnosis not present

## 2020-11-26 DIAGNOSIS — Z419 Encounter for procedure for purposes other than remedying health state, unspecified: Secondary | ICD-10-CM | POA: Diagnosis not present

## 2020-12-06 DIAGNOSIS — H6123 Impacted cerumen, bilateral: Secondary | ICD-10-CM | POA: Diagnosis not present

## 2020-12-15 ENCOUNTER — Encounter (HOSPITAL_COMMUNITY): Payer: Self-pay | Admitting: Emergency Medicine

## 2020-12-15 ENCOUNTER — Emergency Department (HOSPITAL_COMMUNITY): Payer: Medicaid Other

## 2020-12-15 ENCOUNTER — Other Ambulatory Visit: Payer: Self-pay

## 2020-12-15 ENCOUNTER — Inpatient Hospital Stay (HOSPITAL_COMMUNITY)
Admission: EM | Admit: 2020-12-15 | Discharge: 2020-12-19 | DRG: 418 | Disposition: A | Discharge status: Home / Self Care | Payer: Medicaid Other | Attending: Internal Medicine | Admitting: Internal Medicine

## 2020-12-15 DIAGNOSIS — Z7901 Long term (current) use of anticoagulants: Secondary | ICD-10-CM

## 2020-12-15 DIAGNOSIS — D6862 Lupus anticoagulant syndrome: Secondary | ICD-10-CM | POA: Diagnosis present

## 2020-12-15 DIAGNOSIS — Z86718 Personal history of other venous thrombosis and embolism: Secondary | ICD-10-CM

## 2020-12-15 DIAGNOSIS — Z20822 Contact with and (suspected) exposure to covid-19: Secondary | ICD-10-CM | POA: Diagnosis present

## 2020-12-15 DIAGNOSIS — Z86711 Personal history of pulmonary embolism: Secondary | ICD-10-CM | POA: Diagnosis present

## 2020-12-15 DIAGNOSIS — K8000 Calculus of gallbladder with acute cholecystitis without obstruction: Principal | ICD-10-CM | POA: Diagnosis present

## 2020-12-15 DIAGNOSIS — Z79899 Other long term (current) drug therapy: Secondary | ICD-10-CM

## 2020-12-15 DIAGNOSIS — E871 Hypo-osmolality and hyponatremia: Secondary | ICD-10-CM | POA: Diagnosis present

## 2020-12-15 DIAGNOSIS — Z8616 Personal history of COVID-19: Secondary | ICD-10-CM

## 2020-12-15 DIAGNOSIS — R760 Raised antibody titer: Secondary | ICD-10-CM | POA: Diagnosis present

## 2020-12-15 DIAGNOSIS — Z975 Presence of (intrauterine) contraceptive device: Secondary | ICD-10-CM

## 2020-12-15 DIAGNOSIS — D509 Iron deficiency anemia, unspecified: Secondary | ICD-10-CM | POA: Diagnosis present

## 2020-12-15 DIAGNOSIS — E861 Hypovolemia: Secondary | ICD-10-CM | POA: Diagnosis present

## 2020-12-15 DIAGNOSIS — K8063 Calculus of gallbladder and bile duct with acute cholecystitis with obstruction: Secondary | ICD-10-CM | POA: Diagnosis not present

## 2020-12-15 LAB — CBC
HCT: 43.2 % (ref 36.0–46.0)
Hemoglobin: 13.8 g/dL (ref 12.0–15.0)
MCH: 25.5 pg — ABNORMAL LOW (ref 26.0–34.0)
MCHC: 31.9 g/dL (ref 30.0–36.0)
MCV: 79.9 fL — ABNORMAL LOW (ref 80.0–100.0)
Platelets: 240 10*3/uL (ref 150–400)
RBC: 5.41 MIL/uL — ABNORMAL HIGH (ref 3.87–5.11)
RDW: 14 % (ref 11.5–15.5)
WBC: 11.5 10*3/uL — ABNORMAL HIGH (ref 4.0–10.5)
nRBC: 0 % (ref 0.0–0.2)

## 2020-12-15 LAB — COMPREHENSIVE METABOLIC PANEL
ALT: 13 U/L (ref 0–44)
AST: 15 U/L (ref 15–41)
Albumin: 3.9 g/dL (ref 3.5–5.0)
Alkaline Phosphatase: 51 U/L (ref 38–126)
Anion gap: 8 (ref 5–15)
BUN: 7 mg/dL (ref 6–20)
CO2: 22 mmol/L (ref 22–32)
Calcium: 8.8 mg/dL — ABNORMAL LOW (ref 8.9–10.3)
Chloride: 103 mmol/L (ref 98–111)
Creatinine, Ser: 0.64 mg/dL (ref 0.44–1.00)
GFR, Estimated: >60 mL/min (ref >60)
Glucose, Bld: 98 mg/dL (ref 70–99)
Potassium: 3.9 mmol/L (ref 3.5–5.1)
Sodium: 133 mmol/L — ABNORMAL LOW (ref 135–145)
Total Bilirubin: 0.5 mg/dL (ref 0.3–1.2)
Total Protein: 6.9 g/dL (ref 6.5–8.1)

## 2020-12-15 LAB — URINALYSIS, ROUTINE W REFLEX MICROSCOPIC
Bacteria, UA: NONE SEEN
Bilirubin Urine: NEGATIVE
Glucose, UA: NEGATIVE mg/dL
Hgb urine dipstick: NEGATIVE
Ketones, ur: NEGATIVE mg/dL
Nitrite: NEGATIVE
Protein, ur: NEGATIVE mg/dL
Specific Gravity, Urine: 1.008 (ref 1.005–1.030)
pH: 6 (ref 5.0–8.0)

## 2020-12-15 LAB — I-STAT BETA HCG BLOOD, ED (MC, WL, AP ONLY): I-stat hCG, quantitative: <5 m[IU]/mL (ref <5)

## 2020-12-15 LAB — PROTIME-INR
INR: 1.5 — ABNORMAL HIGH (ref 0.8–1.2)
Prothrombin Time: 17.3 seconds — ABNORMAL HIGH (ref 11.4–15.2)

## 2020-12-15 LAB — LIPASE, BLOOD: Lipase: 34 U/L (ref 11–51)

## 2020-12-15 MED ORDER — MORPHINE SULFATE (PF) 4 MG/ML IV SOLN
4.0000 mg | Freq: Once | INTRAVENOUS | Status: AC
  Administered 2020-12-15: 4 mg via INTRAVENOUS
  Filled 2020-12-15: qty 1

## 2020-12-15 MED ORDER — HYDROMORPHONE HCL 1 MG/ML IJ SOLN
0.5000 mg | Freq: Once | INTRAMUSCULAR | Status: AC | PRN
  Administered 2020-12-16: 0.5 mg via INTRAVENOUS
  Filled 2020-12-15: qty 1

## 2020-12-15 MED ORDER — SODIUM CHLORIDE 0.9 % IV BOLUS
1000.0000 mL | Freq: Once | INTRAVENOUS | Status: AC
  Administered 2020-12-15: 1000 mL via INTRAVENOUS

## 2020-12-15 MED ORDER — ONDANSETRON HCL 4 MG/2ML IJ SOLN
4.0000 mg | Freq: Once | INTRAMUSCULAR | Status: AC
  Administered 2020-12-15: 4 mg via INTRAVENOUS
  Filled 2020-12-15: qty 2

## 2020-12-15 NOTE — ED Provider Notes (Incomplete)
[facility] EMERGENCY DEPARTMENT Provider Note   CSN: [id number] Arrival date & time: 12/15/20  1814     History Chief Complaint  Patient presents with  . Abdominal Pain    Alicia Mendez is a 25 y.o. female history of antiphospholipid antibody syndrome, PE and infarction on warfarin who presents the emergency department with a chief complaint of abdominal pain.  The patient reports that yesterday that she developed a fever, T-max 101.7 at home.  She has also been having chills since yesterday, but has been taking Tylenol today for abdominal pain.  She reports upper, constant, worsening abdominal pain that radiates through to her back.  She characterizes the pain as aching and feels very full.  She states that she also has the urge to have a bowel movement, but has not passed any stool today.  She has not eaten all day and when she attempted to eat a banana had 1 episode of vomiting.  She continues to have nausea, but denies any further episodes of vomiting.   She denies shortness of breath, cough, URI symptoms, dysuria, hematuria, vaginal bleeding or discharge, or flank pain.  She has a history of biliary colic that has been managed with diet.  Reports that she has not had a flare of pain in many months. She has previously been evaluated by surgery, but has been hesitant to have a cholecystectomy as she has small children at home and has concerns for childcare.  No known sick contacts.  Reports that she has had COVID-19 twice, last infection was in January 2022.  She is on 10 mg of warfarin on Tuesday and Thursday and takes 7.5 mg every other day.  States that she has forgotten to take her last few doses of warfarin and last dose was on 2/18.  The history is provided by the patient and medical records. No language interpreter was used.       Past Medical History:  Diagnosis Date  . Clotting disorder (HCC)   . Gallstones     Patient Active Problem List    Diagnosis Date Noted  . Headache disorder 07/13/2019  . 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 No pertinent surgical history.   OB History   No obstetric history on file.     No family history on file.  Social History   Tobacco Use  . Smoking status: Never Smoker  . Smokeless tobacco: Never Used  Substance Use Topics  . Alcohol use: Never  . Drug use: Never    Home Medications Prior to Admission medications   Medication Sig Start Date End Date Taking? Authorizing Provider  acetaminophen (TYLENOL) 325 MG tablet Take 650 mg by mouth every 6 (six) hours as needed for moderate pain or headache. 06/03/16  Yes [provider]  levonorgestrel (MIRENA, 52 MG,) 20 MCG/24HR IUD 1 Intra Uterine Device by Intrauterine route daily.   Yes [provider]  warfarin (COUMADIN) 5 MG tablet Take 5 mg by mouth See admin instructions. 10mg  Monday,wednesday,friday. 7.5 mg Tuesday,thursday,saturday and sunday 08/14/20  Yes [provider]  SUMAtriptan (IMITREX) 100 MG tablet Take 100 mg by mouth daily as needed for headache.    [provider]    Allergies    Patient has no known allergies.  Review of Systems   Review of Systems  Physical Exam Updated Vital Signs BP (!) 128/92 (BP Location: Right Arm)   Pulse 82  Temp 99.2 F (37.3 C) (Oral)   Resp 17   LMP 11/24/2020   SpO2 99%   Physical Exam  ED Results / Procedures / Treatments   Labs (all labs ordered are listed, but only abnormal results are displayed) Labs Reviewed  COMPREHENSIVE METABOLIC PANEL - Abnormal; Notable for the following components:      Result Value   Sodium 133 (*)    Calcium 8.8 (*)    All other components within normal limits  CBC - Abnormal; Notable for the following components:   WBC 11.5 (*)    RBC 5.41 (*)    MCV 79.9 (*)    MCH 25.5 (*)    All other components within normal limits   URINALYSIS, ROUTINE W REFLEX MICROSCOPIC - Abnormal; Notable for the following components:   Color, Urine STRAW (*)    Leukocytes,Ua SMALL (*)    All other components within normal limits  PROTIME-INR - Abnormal; Notable for the following components:   Prothrombin Time 17.3 (*)    INR 1.5 (*)    All other components within normal limits  LIPASE, BLOOD  I-STAT BETA HCG BLOOD, ED ([facility], [facility], [facility] ONLY)    EKG None  Radiology No results found.  Procedures Procedures {Remember to document critical care time when appropriate:1}  Medications Ordered in ED Medications  morphine 4 MG/ML injection 4 mg (4 mg Intravenous Given 12/15/20 2336)  ondansetron (ZOFRAN) injection 4 mg (4 mg Intravenous Given 12/15/20 2336)  sodium chloride 0.9 % bolus 1,000 mL (1,000 mLs Intravenous New Bag/Given 12/15/20 2339)    ED Course  I have reviewed the triage vital signs and the nursing notes.  Pertinent labs & imaging results that were available during my care of the patient were reviewed by me and considered in my medical decision making (see chart for details).    MDM Rules/Calculators/A&P                          *** Final Clinical Impression(s) / ED Diagnoses Final diagnoses:  None    Rx / DC Orders ED Discharge Orders    None

## 2020-12-15 NOTE — ED Notes (Signed)
Patient transported to CT 

## 2020-12-15 NOTE — ED Provider Notes (Signed)
[facility]   CSN: [id number] Arrival date & time: 12/15/20  1814     History Chief Complaint  Patient presents with  . Abdominal Pain    Alicia Mendez is a 25 y.o. 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  The patient reports that yesterday that she developed a fever, T-max 101.7 at home.  She has also been having chills since yesterday, but has been taking Tylenol today for abdominal pain.  She reports upper, constant, worsening abdominal pain that radiates through to her back.  She characterizes the pain as aching and feels very full, bloated and constipated.  She reports increased belching.  She states that she also has the urge to have a bowel movement, but has not passed any stool today.  Pain is worse with positional changes.  No other known aggravating alleviating factors.  She has not eaten all day and when she attempted to eat a banana had 1 episode of vomiting.  She continues to have nausea, but denies any further episodes of vomiting.  She states that this episode of pain does feel similar to previous episodes of biliary colic, but somewhat different due to the fullness and abdominal bloating  She denies shortness of breath, cough, URI symptoms, dysuria, diarrhea, hematuria, vaginal bleeding or discharge, melena, hematochezia, or flank pain.  She has a history of biliary colic that has been managed with diet.  Reports that she has not had a flare of pain in many months. She has previously been evaluated by surgery, but has been hesitant to have a cholecystectomy as she has small children at home and has concerns for childcare.  No known sick contacts.  Reports that she has had COVID-19 twice, last infection was in January 2022 (see image of positive test results below under labs).  She is on 10 mg  of warfarin on Tuesday and Thursday and takes 7.5 mg every other day.  States that she has forgotten to take her last few doses of warfarin and last dose was on 2/18.  She reports social alcohol use, no recent increase or decrease.  She does not take NSAIDs.  The history is provided by the patient and medical records. No language interpreter was used.       Past Medical History:  Diagnosis Date  . Clotting disorder (HCC)   . Gallstones     Patient Active Problem List   Diagnosis Date Noted  . Acute cholecystitis 12/16/2020  . Headache disorder 07/13/2019  . 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 No pertinent surgical history.   OB History   No obstetric history on file.     No family history on file.  Social History   Tobacco Use  . Smoking status: Never Smoker  . Smokeless tobacco: Never Used  Substance Use Topics  . Alcohol use: Never  . Drug use: Never    Home Medications Prior to Admission medications   Medication Sig Start Date End Date Taking? Authorizing Provider  acetaminophen (TYLENOL) 325 MG tablet Take 650 mg by mouth every 6 (six) hours as needed for moderate pain or headache. 06/03/16  Yes [provider]  levonorgestrel (MIRENA, 52 MG,) 20 MCG/24HR IUD 1 Intra Uterine Device by Intrauterine route daily.   Yes [provider]  warfarin (COUMADIN)  5 MG tablet Take 5 mg by mouth See admin instructions. 10mg  Monday,wednesday,friday. 7.5 mg Tuesday,thursday,saturday and sunday 08/14/20  Yes [provider]  SUMAtriptan (IMITREX) 100 MG tablet Take 100 mg by mouth daily as needed for headache.    [provider]    Allergies    Patient has no known allergies.  Review of Systems   Review of Systems  Constitutional: Positive for chills and fever. Negative for activity change.  HENT: Negative for congestion and sore throat.   Respiratory:  Negative for cough, shortness of breath and wheezing.   Cardiovascular: Negative for chest pain and palpitations.  Gastrointestinal: Positive for abdominal pain, nausea and vomiting. Negative for blood in stool, constipation and diarrhea.  Genitourinary: Negative for dysuria.  Musculoskeletal: Negative for back pain, joint swelling, myalgias and neck pain.  Skin: Negative for rash and wound.  Allergic/Immunologic: Negative for immunocompromised state.  Neurological: Negative for dizziness, seizures, syncope, weakness, numbness and headaches.  Psychiatric/Behavioral: Negative for confusion.    Physical Exam Updated Vital Signs BP 114/65   Pulse 71   Temp 99.2 F (37.3 C) (Oral)   Resp 19   LMP 11/24/2020   SpO2 100%   Physical Exam Vitals and nursing note reviewed.  Constitutional:      General: She is not in acute distress.    Appearance: She is not ill-appearing, toxic-appearing or diaphoretic.     Comments: Uncomfortable appearing  HENT:     Head: Normocephalic.  Eyes:     Conjunctiva/sclera: Conjunctivae normal.  Cardiovascular:     Rate and Rhythm: Normal rate and regular rhythm.     Heart sounds: No murmur heard. No friction rub. No gallop.   Pulmonary:     Effort: Pulmonary effort is normal. No respiratory distress.     Breath sounds: No stridor. No wheezing, rhonchi or rales.  Chest:     Chest wall: No tenderness.  Abdominal:     General: There is distension.     Palpations: Abdomen is soft. There is no mass.     Tenderness: There is abdominal tenderness. There is guarding and rebound. There is no right CVA tenderness or left CVA tenderness.     Hernia: No hernia is present.     Comments: Mild abdominal distention, but soft.  She is generally tender to palpation throughout the abdomen with rebound and guarding.  Normoactive bowel sounds.  No CVA tenderness bilaterally. She does have more discomfort with palpation of the upper abdomen as compared to the lower  abdomen.  Musculoskeletal:     Cervical back: Neck supple.     Right lower leg: No edema.     Left lower leg: No edema.  Skin:    General: Skin is warm.     Findings: No rash.  Neurological:     Mental Status: She is alert.  Psychiatric:        Behavior: Behavior normal.     ED Results / Procedures / Treatments   Labs (all labs ordered are listed, but only abnormal results are displayed) Labs Reviewed  COMPREHENSIVE METABOLIC PANEL - Abnormal; Notable for the following components:      Result Value   Sodium 133 (*)    Calcium 8.8 (*)    All other components within normal limits  CBC - Abnormal; Notable for the following components:   WBC 11.5 (*)    RBC 5.41 (*)    MCV 79.9 (*)    MCH 25.5 (*)  All other components within normal limits  URINALYSIS, ROUTINE W REFLEX MICROSCOPIC - Abnormal; Notable for the following components:   Color, Urine STRAW (*)    Leukocytes,Ua SMALL (*)    All other components within normal limits  PROTIME-INR - Abnormal; Notable for the following components:   Prothrombin Time 17.3 (*)    INR 1.5 (*)    All other components within normal limits  RESP PANEL BY RT-PCR (FLU A&B, COVID) ARPGX2  LIPASE, BLOOD  I-STAT BETA HCG BLOOD, ED ([facility], [facility], [facility] ONLY)       EKG None  Radiology CT ABDOMEN PELVIS W CONTRAST  Result Date: 12/16/2020 CLINICAL DATA:  Abdominal pain, fever EXAM: CT ABDOMEN AND PELVIS WITH CONTRAST TECHNIQUE: Multidetector CT imaging of the abdomen and pelvis was performed using the standard protocol following bolus administration of intravenous contrast. CONTRAST:  20mL OMNIPAQUE IOHEXOL 300 MG/ML  SOLN COMPARISON:  None. FINDINGS: Lower chest: Few bandlike opacities in the left lung base may reflect subsegmental atelectasis and/or scarring. Lung bases otherwise clear. Normal heart size. No pericardial effusion. Hepatobiliary: No worrisome focal liver lesions. Smooth liver surface contour. Normal hepatic attenuation. Moderately  distended gallbladder with pericholecystic fluid and multiple noncalcified, partially pneumatized gallstones seen both at the gallbladder tip and towards the gallbladder neck. No significant biliary ductal dilatation is seen. No visible calcified intraductal gallstones. Pancreas: No pancreatic ductal dilatation or surrounding inflammatory changes. Spleen: Normal in size. No concerning splenic lesions. Adrenals/Urinary Tract: Normal adrenal glands. Kidneys are normally located with symmetric enhancement. No suspicious renal lesion, urolithiasis or hydronephrosis. Urinary bladder is unremarkable. Stomach/Bowel: Distal esophagus, stomach and duodenal sweep are unremarkable. No small bowel wall thickening or dilatation. No evidence of obstruction. A normal appendix is visualized. No colonic dilatation or wall thickening. Vascular/Lymphatic: No significant vascular findings are present. No enlarged abdominal or pelvic lymph nodes. Reproductive: Retroflexed uterus, expected positioning of an IUD. Normal follicles seen in the left ovary. Other: Trace low-attenuation free fluid in the deep pelvis, nonspecific and possibly reactive or physiologic in a reproductive age female. No free air. No bowel containing hernia. Musculoskeletal: No acute osseous abnormality or suspicious osseous lesion. Mild levocurvature of the lumbar levels, apex L4. IMPRESSION: 1. Moderately distended gallbladder with pericholecystic fluid and multiple noncalcified, partially pneumatized gallstones seen both at the gallbladder tip and towards the gallbladder neck, consistent with acute cholecystitis. No significant biliary ductal dilatation. 2. Trace low-attenuation free fluid in the deep pelvis, nonspecific and possibly reactive or physiologic in a reproductive age female. 3. Retroflexed uterus, expected positioning of an IUD. Electronically Signed   By: Kreg Shropshire M.D.   On: 12/16/2020 00:22    Procedures Procedures   Medications Ordered in  ED Medications  morphine 4 MG/ML injection 4 mg (4 mg Intravenous Given 12/15/20 2336)  ondansetron (ZOFRAN) injection 4 mg (4 mg Intravenous Given 12/15/20 2336)  sodium chloride 0.9 % bolus 1,000 mL (0 mLs Intravenous Stopped 12/16/20 0058)  HYDROmorphone (DILAUDID) injection 0.5 mg (0.5 mg Intravenous Given 12/16/20 0025)  iohexol (OMNIPAQUE) 300 MG/ML solution 80 mL (80 mLs Intravenous Contrast Given 12/16/20 0000)  cefTRIAXone (ROCEPHIN) 2 g in sodium chloride 0.9 % 100 mL IVPB (0 g Intravenous Stopped 12/16/20 0155)  fentaNYL (SUBLIMAZE) injection 75 mcg (75 mcg Intravenous Given 12/16/20 0125)    ED Course  I have reviewed the triage vital signs and the nursing notes.  Pertinent labs & imaging results that were available during my care of the patient were reviewed by me and  considered in my medical decision making (see chart for details).    MDM Rules/Calculators/A&P                          25 year old female with a history of antiphospholipid antibody syndrome, PE and infarction on warfarin presenting with abdominal pain, nausea, vomiting x1, chills, and fever.  She is reportedly feeling very full, bloated, constipated.  Afebrile in the ER.  Vital signs are reassuring.  On exam, she appears uncomfortable.  She has rebound and guarding throughout the abdomen with generalized tenderness.  She does have more discomfort with palpation of the upper abdomen as compared to the lower abdomen.  Differential diagnosis includes cholecystitis, choledocholithiasis, ascending cholangitis, pancreatitis, pyelonephritis, appendicitis, ovarian torsion, bowel obstruction, gastroenteritis, ectopic pregnancy, or PE.  Labs and imaging have been reviewed and independently interpreted by me.  INR is subtherapeutic at 1.5 today since she has not had her home warfarin since 218.  She has a mild leukocytosis of 11.  No metabolic derangements.  However, given generalized pain, rebound tenderness, guarding, will  order CT abdomen pelvis.  She has been given morphine and Zofran for pain control.  CT with moderately distended gallbladder with pericholecystic fluid and multiple gallstones seen about the gallbladder tip and towards the gallbladder neck consistent with acute cholecystitis.  No significant ductal dilatation.   On reevaluation, patient continues to be in significant pain, she reports slight improvement after multiple doses of pain medication.  She was started on Rocephin for cholecystitis.  Since surgery is not immediately indicated overnight, consult to the hospitalist team for admission.  Dr. Sherald Barge has accepted the patient for admission.   Final Clinical Impression(s) / ED Diagnoses Final diagnoses:  Acute cholecystitis due to biliary calculus    Rx / DC Orders ED Discharge Orders    None       Barkley Boards, PA-C 12/16/20 0335    Dione Booze, MD 12/16/20 [phone]

## 2020-12-15 NOTE — ED Triage Notes (Signed)
Pt reports RUQ pain since noon today. States abd feels full and reports lightheadedness.  Fever last night.  History of gallstones.

## 2020-12-16 ENCOUNTER — Inpatient Hospital Stay (HOSPITAL_COMMUNITY): Payer: Medicaid Other | Admitting: Certified Registered Nurse Anesthetist

## 2020-12-16 ENCOUNTER — Encounter (HOSPITAL_COMMUNITY)
Admission: EM | Disposition: A | Discharge status: Home / Self Care | Payer: Self-pay | Source: Home / Self Care | Attending: Internal Medicine

## 2020-12-16 ENCOUNTER — Encounter (HOSPITAL_COMMUNITY): Payer: Self-pay | Admitting: Family Medicine

## 2020-12-16 DIAGNOSIS — Z975 Presence of (intrauterine) contraceptive device: Secondary | ICD-10-CM | POA: Diagnosis not present

## 2020-12-16 DIAGNOSIS — Z86711 Personal history of pulmonary embolism: Secondary | ICD-10-CM | POA: Diagnosis not present

## 2020-12-16 DIAGNOSIS — R109 Unspecified abdominal pain: Secondary | ICD-10-CM | POA: Diagnosis not present

## 2020-12-16 DIAGNOSIS — E861 Hypovolemia: Secondary | ICD-10-CM | POA: Diagnosis not present

## 2020-12-16 DIAGNOSIS — K8000 Calculus of gallbladder with acute cholecystitis without obstruction: Secondary | ICD-10-CM | POA: Diagnosis not present

## 2020-12-16 DIAGNOSIS — Z7901 Long term (current) use of anticoagulants: Secondary | ICD-10-CM | POA: Diagnosis not present

## 2020-12-16 DIAGNOSIS — Z79899 Other long term (current) drug therapy: Secondary | ICD-10-CM | POA: Diagnosis not present

## 2020-12-16 DIAGNOSIS — K8063 Calculus of gallbladder and bile duct with acute cholecystitis with obstruction: Secondary | ICD-10-CM | POA: Diagnosis not present

## 2020-12-16 DIAGNOSIS — K8042 Calculus of bile duct with acute cholecystitis without obstruction: Secondary | ICD-10-CM | POA: Diagnosis not present

## 2020-12-16 DIAGNOSIS — K8010 Calculus of gallbladder with chronic cholecystitis without obstruction: Secondary | ICD-10-CM | POA: Diagnosis not present

## 2020-12-16 DIAGNOSIS — K810 Acute cholecystitis: Secondary | ICD-10-CM | POA: Diagnosis not present

## 2020-12-16 DIAGNOSIS — E871 Hypo-osmolality and hyponatremia: Secondary | ICD-10-CM | POA: Diagnosis present

## 2020-12-16 DIAGNOSIS — Z8616 Personal history of COVID-19: Secondary | ICD-10-CM | POA: Diagnosis not present

## 2020-12-16 DIAGNOSIS — D6862 Lupus anticoagulant syndrome: Secondary | ICD-10-CM | POA: Diagnosis not present

## 2020-12-16 DIAGNOSIS — Z86718 Personal history of other venous thrombosis and embolism: Secondary | ICD-10-CM

## 2020-12-16 DIAGNOSIS — R760 Raised antibody titer: Secondary | ICD-10-CM

## 2020-12-16 DIAGNOSIS — Z20822 Contact with and (suspected) exposure to covid-19: Secondary | ICD-10-CM | POA: Diagnosis not present

## 2020-12-16 DIAGNOSIS — D509 Iron deficiency anemia, unspecified: Secondary | ICD-10-CM | POA: Diagnosis not present

## 2020-12-16 HISTORY — DX: Calculus of gallbladder with acute cholecystitis without obstruction: K80.00

## 2020-12-16 HISTORY — DX: Hypo-osmolality and hyponatremia: E87.1

## 2020-12-16 HISTORY — PX: CHOLECYSTECTOMY: SHX55

## 2020-12-16 LAB — RESP PANEL BY RT-PCR (FLU A&B, COVID) ARPGX2
Influenza A by PCR: NEGATIVE
Influenza B by PCR: NEGATIVE
SARS Coronavirus 2 by RT PCR: NEGATIVE

## 2020-12-16 SURGERY — LAPAROSCOPIC CHOLECYSTECTOMY
Anesthesia: General | Site: Abdomen

## 2020-12-16 MED ORDER — BUPIVACAINE HCL (PF) 0.25 % IJ SOLN
INTRAMUSCULAR | Status: AC
  Filled 2020-12-16: qty 30

## 2020-12-16 MED ORDER — IOHEXOL 300 MG/ML  SOLN
80.0000 mL | Freq: Once | INTRAMUSCULAR | Status: AC | PRN
  Administered 2020-12-16: 80 mL via INTRAVENOUS

## 2020-12-16 MED ORDER — FENTANYL CITRATE (PF) 100 MCG/2ML IJ SOLN
75.0000 ug | Freq: Once | INTRAMUSCULAR | Status: AC
Start: 2020-12-16 — End: 2020-12-16
  Administered 2020-12-16: 75 ug via INTRAVENOUS
  Filled 2020-12-16: qty 2

## 2020-12-16 MED ORDER — ONDANSETRON HCL 4 MG/2ML IJ SOLN
INTRAMUSCULAR | Status: DC | PRN
  Administered 2020-12-16: 4 mg via INTRAVENOUS

## 2020-12-16 MED ORDER — 0.9 % SODIUM CHLORIDE (POUR BTL) OPTIME
TOPICAL | Status: DC | PRN
  Administered 2020-12-16: 1000 mL

## 2020-12-16 MED ORDER — DEXAMETHASONE SODIUM PHOSPHATE 10 MG/ML IJ SOLN
INTRAMUSCULAR | Status: DC | PRN
  Administered 2020-12-16: 8 mg via INTRAVENOUS

## 2020-12-16 MED ORDER — HEPARIN BOLUS VIA INFUSION
2000.0000 [IU] | Freq: Once | INTRAVENOUS | Status: AC
  Administered 2020-12-16: 2000 [IU] via INTRAVENOUS
  Filled 2020-12-16: qty 2000

## 2020-12-16 MED ORDER — HEPARIN (PORCINE) 25000 UT/250ML-% IV SOLN
950.0000 [IU]/h | INTRAVENOUS | Status: DC
  Administered 2020-12-16: 1100 [IU]/h via INTRAVENOUS
  Administered 2020-12-17: 19:00:00 1000 [IU]/h via INTRAVENOUS
  Filled 2020-12-16 (×3): qty 250

## 2020-12-16 MED ORDER — LIDOCAINE 2% (20 MG/ML) 5 ML SYRINGE
INTRAMUSCULAR | Status: AC
  Filled 2020-12-16: qty 5

## 2020-12-16 MED ORDER — MIDAZOLAM HCL 2 MG/2ML IJ SOLN
INTRAMUSCULAR | Status: DC | PRN
  Administered 2020-12-16: 2 mg via INTRAVENOUS

## 2020-12-16 MED ORDER — LACTATED RINGERS IV SOLN: INTRAVENOUS | Status: DC | PRN

## 2020-12-16 MED ORDER — CHLORHEXIDINE GLUCONATE 0.12 % MT SOLN: 15.0000 mL | Freq: Once | OROMUCOSAL | Status: AC

## 2020-12-16 MED ORDER — ROCURONIUM BROMIDE 10 MG/ML (PF) SYRINGE
PREFILLED_SYRINGE | INTRAVENOUS | Status: DC | PRN
  Administered 2020-12-16: 50 mg via INTRAVENOUS

## 2020-12-16 MED ORDER — FENTANYL CITRATE (PF) 250 MCG/5ML IJ SOLN
INTRAMUSCULAR | Status: DC | PRN
  Administered 2020-12-16 (×3): 50 ug via INTRAVENOUS

## 2020-12-16 MED ORDER — ONDANSETRON HCL 4 MG/2ML IJ SOLN
4.0000 mg | Freq: Four times a day (QID) | INTRAMUSCULAR | Status: DC | PRN
  Administered 2020-12-16 – 2020-12-18 (×3): 4 mg via INTRAVENOUS
  Filled 2020-12-16 (×3): qty 2

## 2020-12-16 MED ORDER — ONDANSETRON HCL 4 MG/2ML IJ SOLN
INTRAMUSCULAR | Status: AC
  Filled 2020-12-16: qty 2

## 2020-12-16 MED ORDER — SODIUM CHLORIDE 0.9 % IR SOLN
Status: DC | PRN
  Administered 2020-12-16: 1000 mL

## 2020-12-16 MED ORDER — FENTANYL CITRATE (PF) 100 MCG/2ML IJ SOLN
25.0000 ug | INTRAMUSCULAR | Status: DC | PRN
Start: 2020-12-16 — End: 2020-12-16

## 2020-12-16 MED ORDER — FENTANYL CITRATE (PF) 100 MCG/2ML IJ SOLN
25.0000 ug | INTRAMUSCULAR | Status: DC | PRN
  Administered 2020-12-16 (×2): 50 ug via INTRAVENOUS
  Filled 2020-12-16 (×2): qty 2

## 2020-12-16 MED ORDER — OXYCODONE HCL 5 MG PO TABS
5.0000 mg | ORAL_TABLET | ORAL | Status: DC | PRN
  Administered 2020-12-16: 5 mg via ORAL
  Administered 2020-12-16 – 2020-12-18 (×8): 10 mg via ORAL
  Filled 2020-12-16 (×2): qty 2
  Filled 2020-12-16: qty 1
  Filled 2020-12-16 (×6): qty 2

## 2020-12-16 MED ORDER — SUGAMMADEX SODIUM 200 MG/2ML IV SOLN
INTRAVENOUS | Status: DC | PRN
  Administered 2020-12-16: 50 mg via INTRAVENOUS
  Administered 2020-12-16: 100 mg via INTRAVENOUS
  Administered 2020-12-16: 50 mg via INTRAVENOUS

## 2020-12-16 MED ORDER — ONDANSETRON HCL 4 MG/2ML IJ SOLN: 4.0000 mg | Freq: Four times a day (QID) | INTRAMUSCULAR | Status: DC | PRN

## 2020-12-16 MED ORDER — LACTATED RINGERS IV SOLN: INTRAVENOUS | Status: AC

## 2020-12-16 MED ORDER — ACETAMINOPHEN 325 MG PO TABS: 650.0000 mg | ORAL_TABLET | Freq: Four times a day (QID) | ORAL | Status: DC | PRN

## 2020-12-16 MED ORDER — BUPIVACAINE HCL 0.25 % IJ SOLN
INTRAMUSCULAR | Status: DC | PRN
  Administered 2020-12-16: 20 mL

## 2020-12-16 MED ORDER — PROPOFOL 10 MG/ML IV BOLUS
INTRAVENOUS | Status: DC | PRN
  Administered 2020-12-16: 200 mg via INTRAVENOUS

## 2020-12-16 MED ORDER — CHLORHEXIDINE GLUCONATE 0.12 % MT SOLN
OROMUCOSAL | Status: AC
  Administered 2020-12-16: 15 mL via OROMUCOSAL
  Filled 2020-12-16: qty 15

## 2020-12-16 MED ORDER — FENTANYL CITRATE (PF) 100 MCG/2ML IJ SOLN
75.0000 ug | INTRAMUSCULAR | Status: DC | PRN
  Administered 2020-12-16: 75 ug via INTRAVENOUS
  Filled 2020-12-16: qty 2

## 2020-12-16 MED ORDER — FENTANYL CITRATE (PF) 250 MCG/5ML IJ SOLN
INTRAMUSCULAR | Status: AC
  Filled 2020-12-16: qty 5

## 2020-12-16 MED ORDER — DEXMEDETOMIDINE (PRECEDEX) IN NS 20 MCG/5ML (4 MCG/ML) IV SYRINGE
PREFILLED_SYRINGE | INTRAVENOUS | Status: DC | PRN
  Administered 2020-12-16: 12 ug via INTRAVENOUS

## 2020-12-16 MED ORDER — PROPOFOL 10 MG/ML IV BOLUS
INTRAVENOUS | Status: AC
  Filled 2020-12-16: qty 20

## 2020-12-16 MED ORDER — SODIUM CHLORIDE 0.9 % IV SOLN
2.0000 g | Freq: Once | INTRAVENOUS | Status: AC
  Administered 2020-12-16: 2 g via INTRAVENOUS
  Filled 2020-12-16: qty 20

## 2020-12-16 MED ORDER — OXYCODONE HCL 5 MG/5ML PO SOLN
5.0000 mg | Freq: Once | ORAL | Status: DC | PRN
Start: 2020-12-16 — End: 2020-12-16

## 2020-12-16 MED ORDER — METRONIDAZOLE IN NACL 5-0.79 MG/ML-% IV SOLN
500.0000 mg | Freq: Three times a day (TID) | INTRAVENOUS | Status: DC
  Administered 2020-12-16 – 2020-12-17 (×4): 500 mg via INTRAVENOUS
  Filled 2020-12-16 (×5): qty 100

## 2020-12-16 MED ORDER — FENTANYL CITRATE (PF) 100 MCG/2ML IJ SOLN
INTRAMUSCULAR | Status: AC
  Administered 2020-12-16: 50 ug via INTRAVENOUS
  Filled 2020-12-16: qty 2

## 2020-12-16 MED ORDER — MIDAZOLAM HCL 2 MG/2ML IJ SOLN
INTRAMUSCULAR | Status: AC
  Filled 2020-12-16: qty 2

## 2020-12-16 MED ORDER — OXYCODONE HCL 5 MG PO TABS
5.0000 mg | ORAL_TABLET | Freq: Once | ORAL | Status: DC | PRN
Start: 2020-12-16 — End: 2020-12-16

## 2020-12-16 MED ORDER — ROCURONIUM BROMIDE 10 MG/ML (PF) SYRINGE
PREFILLED_SYRINGE | INTRAVENOUS | Status: AC
  Filled 2020-12-16: qty 10

## 2020-12-16 MED ORDER — LIDOCAINE 2% (20 MG/ML) 5 ML SYRINGE
INTRAMUSCULAR | Status: DC | PRN
  Administered 2020-12-16: 40 mg via INTRAVENOUS

## 2020-12-16 MED ORDER — LACTATED RINGERS IV SOLN: INTRAVENOUS | Status: DC

## 2020-12-16 MED ORDER — ACETAMINOPHEN 650 MG RE SUPP: 650.0000 mg | Freq: Four times a day (QID) | RECTAL | Status: DC | PRN

## 2020-12-16 MED ORDER — SODIUM CHLORIDE 0.9 % IV SOLN
2.0000 g | INTRAVENOUS | Status: DC
  Filled 2020-12-16: qty 20

## 2020-12-16 MED ORDER — DEXAMETHASONE SODIUM PHOSPHATE 10 MG/ML IJ SOLN
INTRAMUSCULAR | Status: AC
  Filled 2020-12-16: qty 1

## 2020-12-16 MED ORDER — ACETAMINOPHEN 325 MG PO TABS
650.0000 mg | ORAL_TABLET | Freq: Four times a day (QID) | ORAL | Status: DC
  Administered 2020-12-16 – 2020-12-17 (×2): 650 mg via ORAL
  Filled 2020-12-16 (×3): qty 2

## 2020-12-16 MED ORDER — HEPARIN (PORCINE) 25000 UT/250ML-% IV SOLN
1100.0000 [IU]/h | INTRAVENOUS | Status: DC
  Administered 2020-12-16: 1100 [IU]/h via INTRAVENOUS
  Filled 2020-12-16: qty 250

## 2020-12-16 SURGICAL SUPPLY — 40 items
APPLIER CLIP ROT 10 11.4 M/L (STAPLE) ×3
BLADE CLIPPER SURG (BLADE) IMPLANT
CANISTER SUCT 3000ML PPV (MISCELLANEOUS) ×3 IMPLANT
CATH CHOLANG 76X19 KUMAR (CATHETERS) IMPLANT
CHLORAPREP W/TINT 26 (MISCELLANEOUS) ×3 IMPLANT
CLIP APPLIE ROT 10 11.4 M/L (STAPLE) ×2 IMPLANT
CLIP VESOLOCK MED LG 6/CT (CLIP) ×3 IMPLANT
COVER MAYO STAND STRL (DRAPES) IMPLANT
COVER SURGICAL LIGHT HANDLE (MISCELLANEOUS) ×3 IMPLANT
COVER WAND RF STERILE (DRAPES) IMPLANT
DERMABOND ADVANCED (GAUZE/BANDAGES/DRESSINGS) ×1
DERMABOND ADVANCED .7 DNX12 (GAUZE/BANDAGES/DRESSINGS) ×2 IMPLANT
DRAPE C-ARM 42X120 X-RAY (DRAPES) IMPLANT
ELECT REM PT RETURN 9FT ADLT (ELECTROSURGICAL) ×3
ELECTRODE REM PT RTRN 9FT ADLT (ELECTROSURGICAL) ×2 IMPLANT
GLOVE SURG SS PI 7.0 STRL IVOR (GLOVE) ×3 IMPLANT
GLOVE SURG UNDER POLY LF SZ7 (GLOVE) ×3 IMPLANT
GOWN STRL REUS W/ TWL LRG LVL3 (GOWN DISPOSABLE) ×6 IMPLANT
GOWN STRL REUS W/TWL LRG LVL3 (GOWN DISPOSABLE) ×3
GRASPER SUT TROCAR 14GX15 (MISCELLANEOUS) ×3 IMPLANT
KIT BASIN OR (CUSTOM PROCEDURE TRAY) ×3 IMPLANT
KIT TURNOVER KIT B (KITS) ×3 IMPLANT
NEEDLE 22X1 1/2 (OR ONLY) (NEEDLE) ×3 IMPLANT
NS IRRIG 1000ML POUR BTL (IV SOLUTION) ×3 IMPLANT
PAD ARMBOARD 7.5X6 YLW CONV (MISCELLANEOUS) ×3 IMPLANT
POUCH RETRIEVAL ECOSAC 10 (ENDOMECHANICALS) ×2 IMPLANT
POUCH RETRIEVAL ECOSAC 10MM (ENDOMECHANICALS) ×1
SCISSORS LAP 5X35 DISP (ENDOMECHANICALS) ×3 IMPLANT
SET IRRIG TUBING LAPAROSCOPIC (IRRIGATION / IRRIGATOR) ×3 IMPLANT
SET TUBE SMOKE EVAC HIGH FLOW (TUBING) ×3 IMPLANT
SLEEVE ENDOPATH XCEL 5M (ENDOMECHANICALS) ×6 IMPLANT
SPECIMEN JAR SMALL (MISCELLANEOUS) ×3 IMPLANT
STOPCOCK 4 WAY LG BORE MALE ST (IV SETS) IMPLANT
SUT MNCRL AB 4-0 PS2 18 (SUTURE) ×3 IMPLANT
TOWEL GREEN STERILE (TOWEL DISPOSABLE) ×3 IMPLANT
TOWEL GREEN STERILE FF (TOWEL DISPOSABLE) ×3 IMPLANT
TRAY LAPAROSCOPIC MC (CUSTOM PROCEDURE TRAY) ×3 IMPLANT
TROCAR XCEL 12X100 BLDLESS (ENDOMECHANICALS) ×3 IMPLANT
TROCAR XCEL NON-BLD 5MMX100MML (ENDOMECHANICALS) ×3 IMPLANT
WATER STERILE IRR 1000ML POUR (IV SOLUTION) ×3 IMPLANT

## 2020-12-16 NOTE — Progress Notes (Signed)
ANTICOAGULATION CONSULT NOTE - Follow Up Consult  Pharmacy Consult for heparin Indication: history of antiphospholipid antibody syndrome and PE/infarction  No Known Allergies  Patient Measurements: Height: 5\' 3"  (160 cm) Weight: 74.8 kg (165 lb) (from 2021 records) IBW/kg (Calculated) : 52.4 Heparin Dosing Weight: 70kg  Vital Signs: Temp: 97.3 F (36.3 C) (02/21 1535) Temp Source: Oral (02/21 0751) BP: 104/71 (02/21 1200) Pulse Rate: 83 (02/21 1145)  Labs: Recent Labs    12/15/20 1849 12/15/20 2312  HGB 13.8  --   HCT 43.2  --   PLT 240  --   LABPROT  --  17.3*  INR  --  1.5*  CREATININE 0.64  --     Estimated Creatinine Clearance: 103.3 mL/min (by C-G formula based on SCr of 0.64 mg/dL).   Medical History: Past Medical History:  Diagnosis Date  . Clotting disorder (HCC)   . Gallstones     Assessment: 25yo female c/o RUQ pain and fever, awaiting surgical consult for acute cholecystitis, to transition from Coumadin to UFH for history of antiphospholipid antibody syndrome, PE, and infarction; current INR below goal w/ last dose of Coumadin taken 2/18.  Patient is s/p laparoscopic cholecystectomy today. Heparin order placed by surgery PA to begin at 2130 today - confirmed with oncall MD Dr. 2131.   Goal of Therapy:  Heparin level 0.3-0.7 units/ml Monitor platelets by anticoagulation protocol: Yes   Plan:  Start heparin at 1100 units/hr at 2130 per surgery Check heparin level at 0330 Monitor heparin level, CBC and S/S of bleeding daily Follow up plans for resuming warfarin  2131, PharmD Clinical Pharmacist  12/16/2020,6:06 PM

## 2020-12-16 NOTE — Anesthesia Procedure Notes (Signed)
Procedure Name: Intubation Date/Time: 12/16/2020 2:24 PM Performed by: Modena Morrow, CRNA Pre-anesthesia Checklist: Patient identified, Emergency Drugs available, Suction available and Patient being monitored Patient Re-evaluated:Patient Re-evaluated prior to induction Oxygen Delivery Method: Circle system utilized Preoxygenation: Pre-oxygenation with 100% oxygen Induction Type: IV induction Ventilation: Mask ventilation without difficulty Laryngoscope Size: Miller and 2 Grade View: Grade I Tube type: Oral Tube size: 7.0 mm Number of attempts: 1 Airway Equipment and Method: Stylet and Oral airway Placement Confirmation: ETT inserted through vocal cords under direct vision,  positive ETCO2 and breath sounds checked- equal and bilateral Secured at: 21 cm Tube secured with: Tape Dental Injury: Teeth and Oropharynx as per pre-operative assessment

## 2020-12-16 NOTE — Op Note (Signed)
PATIENT:  Alicia Mendez  25 y.o. female  PRE-OPERATIVE DIAGNOSIS:  Acute Cholecystitis  POST-OPERATIVE DIAGNOSIS:  Acute Cholecystitis  PROCEDURE:  Procedure(s): LAPAROSCOPIC CHOLECYSTECTOMY  SURGEON:  Surgeon(s): Oaklen Thiam, De Blanch, MD  ASSISTANT: none  ANESTHESIA:   local and general  Indications for procedure: Alicia Mendez is a 25 y.o. female with symptoms of Abdominal pain and Nausea and vomiting consistent with gallbladder disease, Confirmed by ultrasound.  Description of procedure: The patient was brought into the operative suite, placed supine. Anesthesia was administered with endotracheal tube. Patient was strapped in place and foot board was secured. All pressure points were offloaded by foam padding. The patient was prepped and draped in the usual sterile fashion.  A periumbilical incision was made and optical entry was used to enter the abdomen. 2 5 mm trocars were placed on in the right lateral space on in the right subcostal space. A 71mm trocar was placed in the subxiphoid space. Marcaine was infused to the subxiphoid space and lateral upper right abdomen in the transversus abdominis plane. Next the patient was placed in reverse trendelenberg. The gallbladder appearedfull of stones, dilated and acutely inflamed.   The gallbladder was retracted cephalad and lateral. The peritoneum was reflected off the infundibulum working lateral to medial. The cystic duct and cystic artery were identified and further dissection revealed a critical view.  The cystic duct and cystic artery were doubly clipped and ligated.   The gallbladder was removed off the liver bed with cautery. The Gallbladder was placed in a specimen bag. The gallbladder fossa was irrigated and hemostasis was applied with cautery. The gallbladder was removed via the 68mm trocar. loose stones were removed. Pneumoperitoneum was removed, all trocar were removed. All incisions were closed with 4-0 monocryl  subcuticular stitch. The patient woke from anesthesia and was brought to PACU in stable condition. All counts were correct  Findings: acutely inflamed dilated and edematous gallbladder  Specimen: gallbladder  Blood loss: 30 ml  Local anesthesia: 20 ml Marcaine  Complications: none  PLAN OF CARE: Admit to inpatient   PATIENT DISPOSITION:  PACU - hemodynamically stable.  Images:      Feliciana Rossetti, M.D. General, Bariatric, & Minimally Invasive Surgery [facility] [place]

## 2020-12-16 NOTE — Progress Notes (Signed)
ANTICOAGULATION CONSULT NOTE - Initial Consult  Pharmacy Consult for heparin Indication: history of antiphospholipid antibody syndrome and PE/infarction  No Known Allergies  Patient Measurements: Height: 5\' 3"  (160 cm) Weight: 74.8 kg (165 lb) (from 2021 records) IBW/kg (Calculated) : 52.4 Heparin Dosing Weight: 70kg  Vital Signs: Temp: 99.2 F (37.3 C) (02/20 2132) Temp Source: Oral (02/20 2132) BP: 104/77 (02/21 0400) Pulse Rate: 69 (02/21 0400)  Labs: Recent Labs    12/15/20 1849 12/15/20 2312  HGB 13.8  --   HCT 43.2  --   PLT 240  --   LABPROT  --  17.3*  INR  --  1.5*  CREATININE 0.64  --     Estimated Creatinine Clearance: 103.3 mL/min (by C-G formula based on SCr of 0.64 mg/dL).   Medical History: Past Medical History:  Diagnosis Date  . Clotting disorder (HCC)   . Gallstones     Assessment: 25yo female c/o RUQ pain and fever, awaiting surgical consult for acute cholecystitis, to transition from Coumadin to UFH for history of antiphospholipid antibody syndrome, PE, and infarction; current INR below goal w/ last dose of Coumadin taken 2/18.  Goal of Therapy:  Heparin level 0.3-0.7 units/ml Monitor platelets by anticoagulation protocol: Yes   Plan:  Will give small heparin bolus of 2000 units and start heparin gtt at 1100 units/hr and monitor heparin levels and CBC.  3/18, PharmD, BCPS  12/16/2020,5:46 AM

## 2020-12-16 NOTE — Progress Notes (Signed)
Pharmacy notified RN that patient needs to start coumadin tonight and needs an order from MD. On call Md notified Dr Rueben Bash and MD spoke with pharmacy . Will follow up.

## 2020-12-16 NOTE — H&P (Signed)
History and Physical    Alicia Mendez VZD:[id number] DOB: 02/21/1996 DOA: 12/15/2020  PCP: Patient, No Pcp Per   Patient coming from: Home   Chief Complaint: Abdominal pain, fever  HPI: Alicia Mendez is a 25 y.o. female with medical history significant for antiphospholipid syndrome, DVT and PE on Coumadin, now presenting with severe abdominal pain and fever.  Patient describes at least a year of occasional right upper quadrant abdominal pain, usually mild and self-limited, but developed more intense pain yesterday, reports fever to 101.7 F at home, and had one episode of vomiting.  She reports an unintentional weight loss over the past month due to or frequent abdominal discomfort.  She acknowledges missing some doses of warfarin recently.  She denies cough, sore throat, rhinorrhea, or shortness of breath.  ED Course: Upon arrival to the ED, patient is found to be afebrile, saturating well on room air, and with stable blood pressure.  WBC is 11,500, serum sodium 133, and INR 1.5.  COVID-19 PCR is negative.  Patient was started on IV fluids, given IV analgesics, and Rocephin in the ED.  Review of Systems:  All other systems reviewed and apart from HPI, are negative.  Past Medical History:  Diagnosis Date  . Clotting disorder (HCC)   . Gallstones     History reviewed. No pertinent surgical history.  Social History:   reports that she has never smoked. She has never used smokeless tobacco. She reports that she does not drink alcohol and does not use drugs.  No Known Allergies  History reviewed. No pertinent family history.   Prior to Admission medications   Medication Sig Start Date End Date Taking? Authorizing Provider  acetaminophen (TYLENOL) 325 MG tablet Take 650 mg by mouth every 6 (six) hours as needed for moderate pain or headache. 06/03/16  Yes [provider]  levonorgestrel (MIRENA, 52 MG,) 20 MCG/24HR IUD 1 Intra Uterine Device by Intrauterine route  daily.   Yes [provider]  warfarin (COUMADIN) 5 MG tablet Take 5 mg by mouth See admin instructions. 10mg  Monday,wednesday,friday. 7.5 mg Tuesday,thursday,saturday and sunday 08/14/20  Yes [provider]  SUMAtriptan (IMITREX) 100 MG tablet Take 100 mg by mouth daily as needed for headache.    [provider]    Physical Exam: Vitals:   12/16/20 0030 12/16/20 0045 12/16/20 0205 12/16/20 0400  BP: 117/85 123/90 114/65 104/77  Pulse: 77 94 71 69  Resp: 14 18 19 13   Temp:      TempSrc:      SpO2: 100% 100% 100% 100%    Constitutional: NAD, calm  Eyes: PERTLA, lids and conjunctivae normal ENMT: Mucous membranes are moist. Posterior pharynx clear of any exudate or lesions.   Neck: normal, supple, no masses, no thyromegaly Respiratory: no wheezing, no crackles. No accessory muscle use.  Cardiovascular: S1 & S2 heard, regular rate and rhythm. No extremity edema.   Abdomen: soft, non-distended, tender on right, no rebound pain or guarding. Bowel sounds active.  Musculoskeletal: no clubbing / cyanosis. No joint deformity upper and lower extremities.   Skin: no significant rashes, lesions, ulcers. Warm, dry, well-perfused. Neurologic: CN 2-12 grossly intact. Sensation intact. Moving all extremities.  Psychiatric: Alert and oriented to person, place, and situation. Pleasant and cooperative.    Labs and Imaging on Admission: I have personally reviewed following labs and imaging studies  CBC: Recent Labs  Lab 12/15/20 1849  WBC 11.5*  HGB 13.8  HCT 43.2  MCV 79.9*  PLT 240   Basic Metabolic Panel: Recent Labs  Lab 12/15/20 1849  NA 133*  K 3.9  CL 103  CO2 22  GLUCOSE 98  BUN 7  CREATININE 0.64  CALCIUM 8.8*   GFR: CrCl cannot be calculated (Unknown ideal weight.). Liver Function Tests: Recent Labs  Lab 12/15/20 1849  AST 15  ALT 13  ALKPHOS 51  BILITOT 0.5  PROT 6.9  ALBUMIN 3.9   Recent Labs  Lab 12/15/20 1849  LIPASE 34    No results for input(s): AMMONIA in the last 168 hours. Coagulation Profile: Recent Labs  Lab 12/15/20 2312  INR 1.5*   Cardiac Enzymes: No results for input(s): CKTOTAL, CKMB, CKMBINDEX, TROPONINI in the last 168 hours. BNP (last 3 results) No results for input(s): PROBNP in the last 8760 hours. HbA1C: No results for input(s): HGBA1C in the last 72 hours. CBG: No results for input(s): GLUCAP in the last 168 hours. Lipid Profile: No results for input(s): CHOL, HDL, LDLCALC, TRIG, CHOLHDL, LDLDIRECT in the last 72 hours. Thyroid Function Tests: No results for input(s): TSH, T4TOTAL, FREET4, T3FREE, THYROIDAB in the last 72 hours. Anemia Panel: No results for input(s): VITAMINB12, FOLATE, FERRITIN, TIBC, IRON, RETICCTPCT in the last 72 hours. Urine analysis:    Component Value Date/Time   COLORURINE STRAW (A) 12/15/2020 1907   APPEARANCEUR CLEAR 12/15/2020 1907   LABSPEC 1.008 12/15/2020 1907   PHURINE 6.0 12/15/2020 1907   GLUCOSEU NEGATIVE 12/15/2020 1907   HGBUR NEGATIVE 12/15/2020 1907   BILIRUBINUR NEGATIVE 12/15/2020 1907   KETONESUR NEGATIVE 12/15/2020 1907   PROTEINUR NEGATIVE 12/15/2020 1907   NITRITE NEGATIVE 12/15/2020 1907   LEUKOCYTESUR SMALL (A) 12/15/2020 1907   Sepsis Labs: @LABRCNTIP (procalcitonin:4,lacticidven:4) ) Recent Results (from the past 240 hour(s))  Resp Panel by RT-PCR (Flu A&B, Covid) Nasopharyngeal Swab     Status: None   Collection Time: 12/16/20  1:30 AM   Specimen: Nasopharyngeal Swab; Nasopharyngeal(NP) swabs in vial transport medium  Result Value Ref Range Status   SARS Coronavirus 2 by RT PCR NEGATIVE NEGATIVE Final    Comment: (NOTE) SARS-CoV-2 target nucleic acids are NOT DETECTED.  The SARS-CoV-2 RNA is generally detectable in upper respiratory specimens during the acute phase of infection. The lowest concentration of SARS-CoV-2 viral copies this assay can detect is 138 copies/mL. A negative result does not preclude  SARS-Cov-2 infection and should not be used as the sole basis for treatment or other patient management decisions. A negative result may occur with  improper specimen collection/handling, submission of specimen other than nasopharyngeal swab, presence of viral mutation(s) within the areas targeted by this assay, and inadequate number of viral copies(<138 copies/mL). A negative result must be combined with clinical observations, patient history, and epidemiological information. The expected result is Negative.  Fact Sheet for Patients:  BloggerCourse.comhttps://www.fda.gov/media/152166/download  Fact Sheet for Healthcare Providers:  SeriousBroker.ithttps://www.fda.gov/media/152162/download  This test is no t yet approved or cleared by the [place] FDA and  has been authorized for detection and/or diagnosis of SARS-CoV-2 by FDA under an Emergency Use Authorization (EUA). This EUA will remain  in effect (meaning this test can be used) for the duration of the COVID-19 declaration under Section 564(b)(1) of the Act, 21 U.S.C.section 360bbb-3(b)(1), unless the authorization is terminated  or revoked sooner.       Influenza A by PCR NEGATIVE NEGATIVE Final   Influenza B by PCR NEGATIVE NEGATIVE Final    Comment: (NOTE) The Xpert Xpress SARS-CoV-2/FLU/RSV plus assay is intended as an  aid in the diagnosis of influenza from Nasopharyngeal swab specimens and should not be used as a sole basis for treatment. Nasal washings and aspirates are unacceptable for Xpert Xpress SARS-CoV-2/FLU/RSV testing.  Fact Sheet for Patients: BloggerCourse.com  Fact Sheet for Healthcare Providers: SeriousBroker.it  This test is not yet approved or cleared by the [place] FDA and has been authorized for detection and/or diagnosis of SARS-CoV-2 by FDA under an Emergency Use Authorization (EUA). This EUA will remain in effect (meaning this test can be used) for the duration of  the COVID-19 declaration under Section 564(b)(1) of the Act, 21 U.S.C. section 360bbb-3(b)(1), unless the authorization is terminated or revoked.  Performed at [facility], [address], [place], [place] [address]      Radiological Exams on Admission: CT ABDOMEN PELVIS W CONTRAST  Result Date: 12/16/2020 CLINICAL DATA:  Abdominal pain, fever EXAM: CT ABDOMEN AND PELVIS WITH CONTRAST TECHNIQUE: Multidetector CT imaging of the abdomen and pelvis was performed using the standard protocol following bolus administration of intravenous contrast. CONTRAST:  20mL OMNIPAQUE IOHEXOL 300 MG/ML  SOLN COMPARISON:  None. FINDINGS: Lower chest: Few bandlike opacities in the left lung base may reflect subsegmental atelectasis and/or scarring. Lung bases otherwise clear. Normal heart size. No pericardial effusion. Hepatobiliary: No worrisome focal liver lesions. Smooth liver surface contour. Normal hepatic attenuation. Moderately distended gallbladder with pericholecystic fluid and multiple noncalcified, partially pneumatized gallstones seen both at the gallbladder tip and towards the gallbladder neck. No significant biliary ductal dilatation is seen. No visible calcified intraductal gallstones. Pancreas: No pancreatic ductal dilatation or surrounding inflammatory changes. Spleen: Normal in size. No concerning splenic lesions. Adrenals/Urinary Tract: Normal adrenal glands. Kidneys are normally located with symmetric enhancement. No suspicious renal lesion, urolithiasis or hydronephrosis. Urinary bladder is unremarkable. Stomach/Bowel: Distal esophagus, stomach and duodenal sweep are unremarkable. No small bowel wall thickening or dilatation. No evidence of obstruction. A normal appendix is visualized. No colonic dilatation or wall thickening. Vascular/Lymphatic: No significant vascular findings are present. No enlarged abdominal or pelvic lymph nodes. Reproductive: Retroflexed uterus, expected positioning of an  IUD. Normal follicles seen in the left ovary. Other: Trace low-attenuation free fluid in the deep pelvis, nonspecific and possibly reactive or physiologic in a reproductive age female. No free air. No bowel containing hernia. Musculoskeletal: No acute osseous abnormality or suspicious osseous lesion. Mild levocurvature of the lumbar levels, apex L4. IMPRESSION: 1. Moderately distended gallbladder with pericholecystic fluid and multiple noncalcified, partially pneumatized gallstones seen both at the gallbladder tip and towards the gallbladder neck, consistent with acute cholecystitis. No significant biliary ductal dilatation. 2. Trace low-attenuation free fluid in the deep pelvis, nonspecific and possibly reactive or physiologic in a reproductive age female. 3. Retroflexed uterus, expected positioning of an IUD. Electronically Signed   By: Kreg Shropshire M.D.   On: 12/16/2020 00:22    Assessment/Plan  1. Acute cholecystitis   - Patient with hx of biliary colic reports more frequent and intense symptoms with unintentional weight loss in the past month and then severe pain and fever at home yesterday  - She is afebrile in ED with mild leukocytosis and CT findings consistent acute cholecystitis  - She was started on IVF, pain-control, and Rocephin in ED  - Plan to continue Rocephin, add Flagyl, continue IVF and pain-control  - Surgery consulting and much appreciated   2. Antiphospholipid syndrome  - Patient has hx of multiple pregnancy losses, DVT, and PE on warfarin  - INR is 1.5 on  admission  - Hold warfarin pending surgical consultation, use IV heparin for now, repeat INR in am    DVT prophylaxis: warfarin pta, IV heparin for now  Code Status: Full  Level of Care: Level of care: Med-Surg Family Communication: Discussed with patient  Disposition Plan:  Patient is from: Home  Anticipated d/c is to: Home  Anticipated d/c date is: 12/19/20 Patient currently: Pending surgical consultation  Consults  called: General surgery    Admission status: Inpatient     Briscoe Deutscher, MD [facility]  12/16/2020, 5:21 AM

## 2020-12-16 NOTE — Anesthesia Preprocedure Evaluation (Signed)
Anesthesia Evaluation  Patient identified by MRN, date of birth, ID band Patient awake    Reviewed: Allergy & Precautions, H&P , NPO status , Patient's Chart, lab work & pertinent test results  Airway Mallampati: II   Neck ROM: full    Dental   Pulmonary neg pulmonary ROS,    breath sounds clear to auscultation       Cardiovascular + DVT   Rhythm:regular Rate:Normal     Neuro/Psych  Headaches,    GI/Hepatic Acute cholecystitis   Endo/Other    Renal/GU      Musculoskeletal   Abdominal   Peds  Hematology   Anesthesia Other Findings   Reproductive/Obstetrics                             Anesthesia Physical Anesthesia Plan  ASA: I  Anesthesia Plan: General   Post-op Pain Management:    Induction: Intravenous  PONV Risk Score and Plan: 3 and Ondansetron, Dexamethasone, Midazolam and Treatment may vary due to age or medical condition  Airway Management Planned: Oral ETT  Additional Equipment:   Intra-op Plan:   Post-operative Plan: Extubation in OR  Informed Consent: I have reviewed the patients History and Physical, chart, labs and discussed the procedure including the risks, benefits and alternatives for the proposed anesthesia with the patient or authorized representative who has indicated his/her understanding and acceptance.     Dental advisory given  Plan Discussed with: CRNA, Anesthesiologist and Surgeon  Anesthesia Plan Comments:         Anesthesia Quick Evaluation

## 2020-12-16 NOTE — Progress Notes (Signed)
Progress Note    Alicia Mendez  CXK:[id number] DOB: 24-Aug-1996  DOA: 12/15/2020 PCP: Arther Abbott, MD   Brief Narrative:   Chief complaint: Abdominal pain, fever.  Medical records reviewed and are as summarized below:  Alicia Mendez is an 25 y.o. female with a PMH of antiphospholipid syndrome, DVT and PE on Coumadin, who presented to the ED with complaints of severe abdominal pain and fever. WBC 11.5 and CT consistent with acute cholecystitis. Surgery consulted.  Assessment/Plan:   Principal Problem:   Acute cholecystitis due to biliary calculus, POA Given h/o antiphospholipid syndrome, DVT/PE on chronic coumadin, at higher operative risk despite lack of LFT elevation.  RUQ pain/leukocytosis and CT findings consistent with cholecystitis. Surgeon plans to proceed with lap chole today. Heparin d/c'd in anticipation of surgery.  Will need to resume coumadin post-operatively when OK with surgeon. Continue Ceftriaxone.  Active Problems:   Anti-cardiolipin antibody positive, chronic, with h/o PE/DVT Placed on heparin in ED, now on hold.  Resume post-op.    Hyponatremia, POA Mild. Repeat BMET in a.m.  Nutritional status        Body mass index is 29.23 kg/m.   Family Communication/Anticipated D/C date and plan/Code Status   DVT prophylaxis: IV heparin (held for surgery)   Current Level of Care:: Level of care: Med-Surg Code Status: Full Code.  Family Communication: No family at the bedside. Disposition Plan: Status is: Inpatient  Remains inpatient appropriate because:surgery planned for today then will need heparin bridging to coumadin given her h/o antiphospholipid syndrome and PE/DVT.   Dispo: The patient is from: Home              Anticipated d/c is to: Home              Anticipated d/c date is: 3 days              Patient currently is not medically stable to d/c.   Difficult to place patient No         Medical Consultants:     Surgery   Anti-Infectives:    Rocephin 12/16/20-->  Flagyl 12/16/20-->    Subjective:   Complains of RUQ abdominal pain, rated 5/10, with some nausea that improved with anti-emetics. No diarrhea, fever.   Objective:    Vitals:   12/16/20 0600 12/16/20 0700 12/16/20 0751 12/16/20 0800  BP: (!) 124/93 117/81  107/83  Pulse:  85  93  Resp: 20 (!) 23  15  Temp:   98.4 F (36.9 C)   TempSrc:   Oral   SpO2:  100%  99%  Weight:      Height:        Intake/Output Summary (Last 24 hours) at 12/16/2020 0911 Last data filed at 12/16/2020 9702 Gross per 24 hour  Intake 1125 ml  Output -  Net 1125 ml   Filed Weights   12/16/20 0500  Weight: 74.8 kg    Exam: General: No acute distress. Cardiovascular: Heart sounds show a regular rate, and rhythm. No gallops or rubs. No murmurs. No JVD. Lungs: Clear to auscultation bilaterally with good air movement. No rales, rhonchi or wheezes. Abdomen: Soft, tender RUQ, nondistended with normal active bowel sounds. No masses. No hepatosplenomegaly. Neurological: Alert and oriented 3. Moves all extremities 4 with equal strength. Cranial nerves II through XII grossly intact. Skin: Warm and dry. No rashes or lesions. Extremities: No clubbing or cyanosis. No edema. Pedal pulses 2+. Psychiatric: Mood and affect are  normal. Insight and judgment are good.     Data Reviewed:   I have personally reviewed following labs and imaging studies:  Labs: Labs show the following:   Basic Metabolic Panel: Recent Labs  Lab 12/15/20 1849  NA 133*  K 3.9  CL 103  CO2 22  GLUCOSE 98  BUN 7  CREATININE 0.64  CALCIUM 8.8*   GFR Estimated Creatinine Clearance: 103.3 mL/min (by C-G formula based on SCr of 0.64 mg/dL). Liver Function Tests: Recent Labs  Lab 12/15/20 1849  AST 15  ALT 13  ALKPHOS 51  BILITOT 0.5  PROT 6.9  ALBUMIN 3.9   Recent Labs  Lab 12/15/20 1849  LIPASE 34   Coagulation profile Recent Labs  Lab  12/15/20 2312  INR 1.5*    CBC: Recent Labs  Lab 12/15/20 1849  WBC 11.5*  HGB 13.8  HCT 43.2  MCV 79.9*  PLT 240    Microbiology Recent Results (from the past 240 hour(s))  Resp Panel by RT-PCR (Flu A&B, Covid) Nasopharyngeal Swab     Status: None   Collection Time: 12/16/20  1:30 AM   Specimen: Nasopharyngeal Swab; Nasopharyngeal(NP) swabs in vial transport medium  Result Value Ref Range Status   SARS Coronavirus 2 by RT PCR NEGATIVE NEGATIVE Final    Comment: (NOTE) SARS-CoV-2 target nucleic acids are NOT DETECTED.  The SARS-CoV-2 RNA is generally detectable in upper respiratory specimens during the acute phase of infection. The lowest concentration of SARS-CoV-2 viral copies this assay can detect is 138 copies/mL. A negative result does not preclude SARS-Cov-2 infection and should not be used as the sole basis for treatment or other patient management decisions. A negative result may occur with  improper specimen collection/handling, submission of specimen other than nasopharyngeal swab, presence of viral mutation(s) within the areas targeted by this assay, and inadequate number of viral copies(<138 copies/mL). A negative result must be combined with clinical observations, patient history, and epidemiological information. The expected result is Negative.  Fact Sheet for Patients:  BloggerCourse.com  Fact Sheet for Healthcare Providers:  SeriousBroker.it  This test is no t yet approved or cleared by the [place] FDA and  has been authorized for detection and/or diagnosis of SARS-CoV-2 by FDA under an Emergency Use Authorization (EUA). This EUA will remain  in effect (meaning this test can be used) for the duration of the COVID-19 declaration under Section 564(b)(1) of the Act, 21 U.S.C.section 360bbb-3(b)(1), unless the authorization is terminated  or revoked sooner.       Influenza A by PCR NEGATIVE  NEGATIVE Final   Influenza B by PCR NEGATIVE NEGATIVE Final    Comment: (NOTE) The Xpert Xpress SARS-CoV-2/FLU/RSV plus assay is intended as an aid in the diagnosis of influenza from Nasopharyngeal swab specimens and should not be used as a sole basis for treatment. Nasal washings and aspirates are unacceptable for Xpert Xpress SARS-CoV-2/FLU/RSV testing.  Fact Sheet for Patients: BloggerCourse.com  Fact Sheet for Healthcare Providers: SeriousBroker.it  This test is not yet approved or cleared by the [place] FDA and has been authorized for detection and/or diagnosis of SARS-CoV-2 by FDA under an Emergency Use Authorization (EUA). This EUA will remain in effect (meaning this test can be used) for the duration of the COVID-19 declaration under Section 564(b)(1) of the Act, 21 U.S.C. section 360bbb-3(b)(1), unless the authorization is terminated or revoked.  Performed at [facility], [address], [place], [place] [address]     Procedures and  diagnostic studies:  CT ABDOMEN PELVIS W CONTRAST  Result Date: 12/16/2020 CLINICAL DATA:  Abdominal pain, fever EXAM: CT ABDOMEN AND PELVIS WITH CONTRAST TECHNIQUE: Multidetector CT imaging of the abdomen and pelvis was performed using the standard protocol following bolus administration of intravenous contrast. CONTRAST:  65mL OMNIPAQUE IOHEXOL 300 MG/ML  SOLN COMPARISON:  None. FINDINGS: Lower chest: Few bandlike opacities in the left lung base may reflect subsegmental atelectasis and/or scarring. Lung bases otherwise clear. Normal heart size. No pericardial effusion. Hepatobiliary: No worrisome focal liver lesions. Smooth liver surface contour. Normal hepatic attenuation. Moderately distended gallbladder with pericholecystic fluid and multiple noncalcified, partially pneumatized gallstones seen both at the gallbladder tip and towards the gallbladder neck. No significant biliary  ductal dilatation is seen. No visible calcified intraductal gallstones. Pancreas: No pancreatic ductal dilatation or surrounding inflammatory changes. Spleen: Normal in size. No concerning splenic lesions. Adrenals/Urinary Tract: Normal adrenal glands. Kidneys are normally located with symmetric enhancement. No suspicious renal lesion, urolithiasis or hydronephrosis. Urinary bladder is unremarkable. Stomach/Bowel: Distal esophagus, stomach and duodenal sweep are unremarkable. No small bowel wall thickening or dilatation. No evidence of obstruction. A normal appendix is visualized. No colonic dilatation or wall thickening. Vascular/Lymphatic: No significant vascular findings are present. No enlarged abdominal or pelvic lymph nodes. Reproductive: Retroflexed uterus, expected positioning of an IUD. Normal follicles seen in the left ovary. Other: Trace low-attenuation free fluid in the deep pelvis, nonspecific and possibly reactive or physiologic in a reproductive age female. No free air. No bowel containing hernia. Musculoskeletal: No acute osseous abnormality or suspicious osseous lesion. Mild levocurvature of the lumbar levels, apex L4. IMPRESSION: 1. Moderately distended gallbladder with pericholecystic fluid and multiple noncalcified, partially pneumatized gallstones seen both at the gallbladder tip and towards the gallbladder neck, consistent with acute cholecystitis. No significant biliary ductal dilatation. 2. Trace low-attenuation free fluid in the deep pelvis, nonspecific and possibly reactive or physiologic in a reproductive age female. 3. Retroflexed uterus, expected positioning of an IUD. Electronically Signed   By: Kreg Shropshire M.D.   On: 12/16/2020 00:22    Medications:    Continuous Infusions: . [START ON 12/17/2020] cefTRIAXone (ROCEPHIN)  IV    . heparin Stopped (12/16/20 0823)  . lactated ringers 90 mL/hr at 12/16/20 0607  . metronidazole Stopped (12/16/20 0707)     LOS: 0 days    Hillery Aldo, MD  [facility]   [facility] How to contact the [facility] Attending or Consulting provider 7A - 7P or covering provider during after hours 7P -7A, for this patient?  1. Check the care team in [facility] and look for a) attending/consulting [facility] provider listed and b) the [facility] team listed 2. Log into [email or web address] and use [facility] universal password to access. If you do not have the password, please contact the hospital operator. 3. Locate the [facility] provider you are looking for under [facility] and page to a number that you can be directly reached. 4. If you still have difficulty reaching the provider, please page the [facility] (Director on Call) for the Hospitalists listed on amion for assistance.  12/16/2020, 9:11 AM

## 2020-12-16 NOTE — Discharge Instructions (Signed)
[facility] LAPAROSCOPIC SURGERY: POST OP INSTRUCTIONS Always review your discharge instruction sheet given to you by the facility where your surgery was performed. IF YOU HAVE DISABILITY OR FAMILY LEAVE FORMS, YOU MUST BRING THEM TO THE OFFICE FOR PROCESSING.   DO NOT GIVE THEM TO YOUR DOCTOR.  PAIN CONTROL  1. First take acetaminophen (Tylenol) AND/or ibuprofen (Advil) to control your pain after surgery.  Follow directions on package.  Taking acetaminophen (Tylenol) and/or ibuprofen (Advil) regularly after surgery will help to control your pain and lower the amount of prescription pain medication you may need.  You should not take more than 3,000 mg (3 grams) of acetaminophen (Tylenol) in 24 hours.  You should not take ibuprofen (Advil), aleve, motrin, naprosyn or other NSAIDS if you have a history of stomach ulcers or chronic kidney disease.  2. A prescription for pain medication may be given to you upon discharge.  Take your pain medication as prescribed, if you still have uncontrolled pain after taking acetaminophen (Tylenol) or ibuprofen (Advil). 3. Use ice packs to help control pain. 4. If you need a refill on your pain medication, please contact your pharmacy.  They will contact our office to request authorization. Prescriptions will not be filled after 5pm or on week-ends.  HOME MEDICATIONS 5. Take your usually prescribed medications unless otherwise directed.  DIET 6. You should follow a light diet the first few days after arrival home.  Be sure to include lots of fluids daily. Avoid fatty, fried foods.   CONSTIPATION 7. It is common to experience some constipation after surgery and if you are taking pain medication.  Increasing fluid intake and taking a stool softener (such as Colace) will usually help or prevent this problem from occurring.  A mild laxative (Milk of Magnesia or Miralax) should be taken according to package instructions if there are no bowel  movements after 48 hours.  WOUND/INCISION CARE 8. Most patients will experience some swelling and bruising in the area of the incisions.  Ice packs will help.  Swelling and bruising can take several days to resolve.  9. Unless discharge instructions indicate otherwise, follow guidelines below  a. STERI-STRIPS - you may remove your outer bandages 48 hours after surgery, and you may shower at that time.  You have steri-strips (small skin tapes) in place directly over the incision.  These strips should be left on the skin for 7-10 days.   b. DERMABOND/SKIN GLUE - you may shower in 24 hours.  The glue will flake off over the next 2-3 weeks. 10. Any sutures or staples will be removed at the office during your follow-up visit.  ACTIVITIES 11. You may resume regular (light) daily activities beginning the next day--such as daily self-care, walking, climbing stairs--gradually increasing activities as tolerated.  You may have sexual intercourse when it is comfortable.  Refrain from any heavy lifting or straining until approved by your doctor. a. You may drive when you are no longer taking prescription pain medication, you can comfortably wear a seatbelt, and you can safely maneuver your car and apply brakes.  FOLLOW-UP 12. You should see your doctor in the office for a follow-up appointment approximately 2-3 weeks after your surgery.  You should have been given your post-op/follow-up appointment when your surgery was scheduled.  If you did not receive a post-op/follow-up appointment, make sure that you call for this appointment within a day or two after you arrive home to insure a convenient appointment time.  WHEN   TO CALL YOUR DOCTOR: 1. Fever over 101.0 2. Inability to urinate 3. Continued bleeding from incision. 4. Increased pain, redness, or drainage from the incision. 5. Increasing abdominal pain  The clinic staff is available to answer your questions during regular business hours.  Please don't  hesitate to call and ask to speak to one of the nurses for clinical concerns.  If you have a medical emergency, go to the nearest emergency room or call 911.  A surgeon from [facility] is always on call at the hospital. [address], [address], [place],   [address] ? [address] [address], [place],    [address] [phone] ? [phone] ? FAX [phone] Web site: [email or web address]  

## 2020-12-16 NOTE — Consult Note (Signed)
Reason for Consult:abdominal pain Referring Physician: Kasie Leccese is an 25 y.o. female.  HPI: 25 yo female with multiple month history of abdominal pain. She was diagnosed with biliary colic and saw Dr. Donell Beers in the office for discussion of surgery. Over the weekend she had worsening pain and persistent pain. Pain is in the upper abdomen in the right side and radiates to the back. It is worse with food intake. Medication helps the pain some. She had nausea and vomiting and fevers over the weekend. She has had diarrhea in the last 2 months.  Past Medical History:  Diagnosis Date  . Clotting disorder (HCC)   . Gallstones     History reviewed. No pertinent surgical history.  History reviewed. No pertinent family history.  Social History:  reports that she has never smoked. She has never used smokeless tobacco. She reports that she does not drink alcohol and does not use drugs.  Allergies: No Known Allergies  Medications: I have reviewed the patient's current medications.  Results for orders placed or performed during the hospital encounter of 12/15/20 (from the past 48 hour(s))  Lipase, blood     Status: None   Collection Time: 12/15/20  6:49 PM  Result Value Ref Range   Lipase 34 11 - 51 U/L    Comment: Performed at [facility], [address], [place], [place] [address]  Comprehensive metabolic panel     Status: Abnormal   Collection Time: 12/15/20  6:49 PM  Result Value Ref Range   Sodium 133 (L) 135 - 145 mmol/L   Potassium 3.9 3.5 - 5.1 mmol/L   Chloride 103 98 - 111 mmol/L   CO2 22 22 - 32 mmol/L   Glucose, Bld 98 70 - 99 mg/dL    Comment: Glucose reference range applies only to samples taken after fasting for at least 8 hours.   BUN 7 6 - 20 mg/dL   Creatinine, Ser 6.01 0.44 - 1.00 mg/dL   Calcium 8.8 (L) 8.9 - 10.3 mg/dL   Total Protein 6.9 6.5 - 8.1 g/dL   Albumin 3.9 3.5 - 5.0 g/dL   AST 15 15 - 41 U/L   ALT 13 0 - 44 U/L   Alkaline  Phosphatase 51 38 - 126 U/L   Total Bilirubin 0.5 0.3 - 1.2 mg/dL   GFR, Estimated >09 >32 mL/min    Comment: (NOTE) Calculated using the CKD-EPI Creatinine Equation (2021)    Anion gap 8 5 - 15    Comment: Performed at [facility], [address] [place], [place] [address]  CBC     Status: Abnormal   Collection Time: 12/15/20  6:49 PM  Result Value Ref Range   WBC 11.5 (H) 4.0 - 10.5 K/uL   RBC 5.41 (H) 3.87 - 5.11 MIL/uL   Hemoglobin 13.8 12.0 - 15.0 g/dL   HCT 22.0 25.4 - 27.0 %   MCV 79.9 (L) 80.0 - 100.0 fL   MCH 25.5 (L) 26.0 - 34.0 pg   MCHC 31.9 30.0 - 36.0 g/dL   RDW 62.3 76.2 - 83.1 %   Platelets 240 150 - 400 K/uL   nRBC 0.0 0.0 - 0.2 %    Comment: Performed at [facility], [address], [place], [place] [address]  I-Stat beta hCG blood, ED     Status: None   Collection Time: 12/15/20  6:49 PM  Result Value Ref Range   I-stat  hCG, quantitative <5.0 <5 mIU/mL   Comment 3            Comment:   GEST. AGE      CONC.  (mIU/mL)   <=1 WEEK        5 - 50     2 WEEKS       50 - 500     3 WEEKS       100 - 10,000     4 WEEKS     1,000 - 30,000        FEMALE AND NON-PREGNANT FEMALE:     LESS THAN 5 mIU/mL   Urinalysis, Routine w reflex microscopic Urine, Clean Catch     Status: Abnormal   Collection Time: 12/15/20  7:07 PM  Result Value Ref Range   Color, Urine STRAW (A) YELLOW   APPearance CLEAR CLEAR   Specific Gravity, Urine 1.008 1.005 - 1.030   pH 6.0 5.0 - 8.0   Glucose, UA NEGATIVE NEGATIVE mg/dL   Hgb urine dipstick NEGATIVE NEGATIVE   Bilirubin Urine NEGATIVE NEGATIVE   Ketones, ur NEGATIVE NEGATIVE mg/dL   Protein, ur NEGATIVE NEGATIVE mg/dL   Nitrite NEGATIVE NEGATIVE   Leukocytes,Ua SMALL (A) NEGATIVE   RBC / HPF 0-5 0 - 5 RBC/hpf   WBC, UA 0-5 0 - 5 WBC/hpf   Bacteria, UA NONE SEEN NONE SEEN   Squamous Epithelial / LPF 0-5 0 - 5    Comment: Performed at [facility], [address], [place], [place] [address]  Protime-INR      Status: Abnormal   Collection Time: 12/15/20 11:12 PM  Result Value Ref Range   Prothrombin Time 17.3 (H) 11.4 - 15.2 seconds   INR 1.5 (H) 0.8 - 1.2    Comment: (NOTE) INR goal varies based on device and disease states. Performed at [facility], [address] [place], [place] [address]   Resp Panel by RT-PCR (Flu A&B, Covid) Nasopharyngeal Swab     Status: None   Collection Time: 12/16/20  1:30 AM   Specimen: Nasopharyngeal Swab; Nasopharyngeal(NP) swabs in vial transport medium  Result Value Ref Range   SARS Coronavirus 2 by RT PCR NEGATIVE NEGATIVE    Comment: (NOTE) SARS-CoV-2 target nucleic acids are NOT DETECTED.  The SARS-CoV-2 RNA is generally detectable in upper respiratory specimens during the acute phase of infection. The lowest concentration of SARS-CoV-2 viral copies this assay can detect is 138 copies/mL. A negative result does not preclude SARS-Cov-2 infection and should not be used as the sole basis for treatment or other patient management decisions. A negative result may occur with  improper specimen collection/handling, submission of specimen other than nasopharyngeal swab, presence of viral mutation(s) within the areas targeted by this assay, and inadequate number of viral copies(<138 copies/mL). A negative result must be combined with clinical observations, patient history, and epidemiological information. The expected result is Negative.  Fact Sheet for Patients:  BloggerCourse.com  Fact Sheet for Healthcare Providers:  SeriousBroker.it  This test is no t yet approved or cleared by the [place] FDA and  has been authorized for detection and/or diagnosis of SARS-CoV-2 by FDA under an Emergency Use Authorization (EUA). This EUA will remain  in effect (meaning this test can be used) for the duration of the COVID-19 declaration under Section 564(b)(1) of the Act, 21 U.S.C.section 360bbb-3(b)(1),  unless the authorization is terminated  or revoked sooner.       Influenza A by  PCR NEGATIVE NEGATIVE   Influenza B by PCR NEGATIVE NEGATIVE    Comment: (NOTE) The Xpert Xpress SARS-CoV-2/FLU/RSV plus assay is intended as an aid in the diagnosis of influenza from Nasopharyngeal swab specimens and should not be used as a sole basis for treatment. Nasal washings and aspirates are unacceptable for Xpert Xpress SARS-CoV-2/FLU/RSV testing.  Fact Sheet for Patients: BloggerCourse.comhttps://www.fda.gov/media/152166/download  Fact Sheet for Healthcare Providers: SeriousBroker.ithttps://www.fda.gov/media/152162/download  This test is not yet approved or cleared by the [place] FDA and has been authorized for detection and/or diagnosis of SARS-CoV-2 by FDA under an Emergency Use Authorization (EUA). This EUA will remain in effect (meaning this test can be used) for the duration of the COVID-19 declaration under Section 564(b)(1) of the Act, 21 U.S.C. section 360bbb-3(b)(1), unless the authorization is terminated or revoked.  Performed at [facility], [address] [place], [place] [id number]     CT ABDOMEN PELVIS W CONTRAST  Result Date: 12/16/2020 CLINICAL DATA:  Abdominal pain, fever EXAM: CT ABDOMEN AND PELVIS WITH CONTRAST TECHNIQUE: Multidetector CT imaging of the abdomen and pelvis was performed using the standard protocol following bolus administration of intravenous contrast. CONTRAST:  80mL OMNIPAQUE IOHEXOL 300 MG/ML  SOLN COMPARISON:  None. FINDINGS: Lower chest: Few bandlike opacities in the left lung base may reflect subsegmental atelectasis and/or scarring. Lung bases otherwise clear. Normal heart size. No pericardial effusion. Hepatobiliary: No worrisome focal liver lesions. Smooth liver surface contour. Normal hepatic attenuation. Moderately distended gallbladder with pericholecystic fluid and multiple noncalcified, partially pneumatized gallstones seen both at the gallbladder tip and towards  the gallbladder neck. No significant biliary ductal dilatation is seen. No visible calcified intraductal gallstones. Pancreas: No pancreatic ductal dilatation or surrounding inflammatory changes. Spleen: Normal in size. No concerning splenic lesions. Adrenals/Urinary Tract: Normal adrenal glands. Kidneys are normally located with symmetric enhancement. No suspicious renal lesion, urolithiasis or hydronephrosis. Urinary bladder is unremarkable. Stomach/Bowel: Distal esophagus, stomach and duodenal sweep are unremarkable. No small bowel wall thickening or dilatation. No evidence of obstruction. A normal appendix is visualized. No colonic dilatation or wall thickening. Vascular/Lymphatic: No significant vascular findings are present. No enlarged abdominal or pelvic lymph nodes. Reproductive: Retroflexed uterus, expected positioning of an IUD. Normal follicles seen in the left ovary. Other: Trace low-attenuation free fluid in the deep pelvis, nonspecific and possibly reactive or physiologic in a reproductive age female. No free air. No bowel containing hernia. Musculoskeletal: No acute osseous abnormality or suspicious osseous lesion. Mild levocurvature of the lumbar levels, apex L4. IMPRESSION: 1. Moderately distended gallbladder with pericholecystic fluid and multiple noncalcified, partially pneumatized gallstones seen both at the gallbladder tip and towards the gallbladder neck, consistent with acute cholecystitis. No significant biliary ductal dilatation. 2. Trace low-attenuation free fluid in the deep pelvis, nonspecific and possibly reactive or physiologic in a reproductive age female. 3. Retroflexed uterus, expected positioning of an IUD. Electronically Signed   By: Kreg ShropshirePrice  DeHay M.D.   On: 12/16/2020 00:22    Review of Systems  Constitutional: Negative for chills and fever.  HENT: Negative for hearing loss.   Eyes: Negative for blurred vision and double vision.  Respiratory: Negative for cough and  hemoptysis.   Cardiovascular: Negative for chest pain and palpitations.  Gastrointestinal: Positive for abdominal pain, nausea and vomiting.  Genitourinary: Negative for dysuria and urgency.  Musculoskeletal: Negative for myalgias and neck pain.  Skin: Negative for itching and rash.  Neurological: Negative for dizziness, tingling and headaches.  Endo/Heme/Allergies: Does not bruise/bleed easily.  Psychiatric/Behavioral: Negative for depression and suicidal ideas.   PE Blood pressure 117/81, pulse 85, temperature 98.4 F (36.9 C), temperature source Oral, resp. rate (!) 23, height 5\' 3"  (1.6 m), weight 74.8 kg, last menstrual period 11/24/2020, SpO2 100 %. Constitutional: NAD; conversant; no deformities Eyes: Moist conjunctiva; no lid lag; anicteric; PERRL Neck: Trachea midline; no thyromegaly Lungs: Normal respiratory effort; no tactile fremitus CV: RRR; no palpable thrills; no pitting edema GI: Abd soft, TTP RUQ with guarding; no palpable hepatosplenomegaly MSK: Normal gait; no clubbing/cyanosis Psychiatric: Appropriate affect; alert and oriented x3 Lymphatic: No palpable cervical or axillary lymphadenopathy Skin: No major subcutaneous nodules. Warm and dry   Assessment/Plan: 25 yo female with lupus anticoagulant on coumadin. INR 1.5 this morning. She presents with signs of acute cholecystitis. -lap chole today -We discussed the etiology of her pain, we discussed treatment options and recommended surgery. We discussed details of surgery including general anesthesia, laparoscopic approach, identification of cystic duct and common bile duct. Ligation of cystic duct and cystic artery. Possible need for intraoperative cholangiogram or open procedure. Possible risks of common bile duct injury, liver injury, cystic duct leak, bleeding, infection, post-cholecystectomy syndrome. The patient showed good understanding and all questions were answered -stop heparin now for surgery early  afternoon -NPO -appropriately has received ceftriaxone  [address] 12/16/2020, 8:19 AM

## 2020-12-16 NOTE — Transfer of Care (Signed)
Immediate Anesthesia Transfer of Care Note  Patient: PHILIPPA VESSEY  Procedure(s) Performed: LAPAROSCOPIC CHOLECYSTECTOMY (N/A Abdomen)  Patient Location: PACU  Anesthesia Type:General  Level of Consciousness: drowsy and patient cooperative  Airway & Oxygen Therapy: Patient Spontanous Breathing  Post-op Assessment: Report given to RN and Post -op Vital signs reviewed and stable  Post vital signs: Reviewed and stable  Last Vitals:  Vitals Value Taken Time  BP 111/73 12/16/20 1534  Temp    Pulse 88 12/16/20 1536  Resp 17 12/16/20 1536  SpO2 96 % 12/16/20 1536  Vitals shown include unvalidated device data.  Last Pain:  Vitals:   12/16/20 1221  TempSrc:   PainSc: Asleep         Complications: No complications documented.

## 2020-12-17 ENCOUNTER — Encounter (HOSPITAL_COMMUNITY): Payer: Self-pay | Admitting: General Surgery

## 2020-12-17 DIAGNOSIS — K8000 Calculus of gallbladder with acute cholecystitis without obstruction: Secondary | ICD-10-CM | POA: Diagnosis not present

## 2020-12-17 LAB — CBC
HCT: 42.6 % (ref 36.0–46.0)
Hemoglobin: 13.3 g/dL (ref 12.0–15.0)
MCH: 24.9 pg — ABNORMAL LOW (ref 26.0–34.0)
MCHC: 31.2 g/dL (ref 30.0–36.0)
MCV: 79.6 fL — ABNORMAL LOW (ref 80.0–100.0)
Platelets: 249 10*3/uL (ref 150–400)
RBC: 5.35 MIL/uL — ABNORMAL HIGH (ref 3.87–5.11)
RDW: 14.3 % (ref 11.5–15.5)
WBC: 10.3 10*3/uL (ref 4.0–10.5)
nRBC: 0 % (ref 0.0–0.2)

## 2020-12-17 LAB — HEPARIN LEVEL (UNFRACTIONATED)
Heparin Unfractionated: 0.54 IU/mL (ref 0.30–0.70)
Heparin Unfractionated: 0.76 IU/mL — ABNORMAL HIGH (ref 0.30–0.70)
Heparin Unfractionated: 0.64 IU/mL (ref 0.30–0.70)

## 2020-12-17 LAB — COMPREHENSIVE METABOLIC PANEL
ALT: 27 U/L (ref 0–44)
AST: 35 U/L (ref 15–41)
Albumin: 3.4 g/dL — ABNORMAL LOW (ref 3.5–5.0)
Alkaline Phosphatase: 55 U/L (ref 38–126)
Anion gap: 9 (ref 5–15)
BUN: 5 mg/dL — ABNORMAL LOW (ref 6–20)
CO2: 25 mmol/L (ref 22–32)
Calcium: 8.7 mg/dL — ABNORMAL LOW (ref 8.9–10.3)
Chloride: 104 mmol/L (ref 98–111)
Creatinine, Ser: 0.81 mg/dL (ref 0.44–1.00)
GFR, Estimated: >60 mL/min (ref >60)
Glucose, Bld: 118 mg/dL — ABNORMAL HIGH (ref 70–99)
Potassium: 4.4 mmol/L (ref 3.5–5.1)
Sodium: 138 mmol/L (ref 135–145)
Total Bilirubin: 0.4 mg/dL (ref 0.3–1.2)
Total Protein: 7 g/dL (ref 6.5–8.1)

## 2020-12-17 LAB — PROTIME-INR
INR: 1.6 — ABNORMAL HIGH (ref 0.8–1.2)
Prothrombin Time: 18.2 seconds — ABNORMAL HIGH (ref 11.4–15.2)

## 2020-12-17 LAB — HIV ANTIBODY (ROUTINE TESTING W REFLEX): HIV Screen 4th Generation wRfx: NONREACTIVE

## 2020-12-17 LAB — SURGICAL PATHOLOGY

## 2020-12-17 MED ORDER — WARFARIN - PHARMACIST DOSING INPATIENT: Freq: Every day | Status: DC

## 2020-12-17 MED ORDER — PSEUDOEPHEDRINE HCL 30 MG PO TABS
30.0000 mg | ORAL_TABLET | ORAL | Status: AC | PRN
Start: 2020-12-17 — End: 2020-12-18
  Administered 2020-12-17 – 2020-12-18 (×2): 30 mg via ORAL
  Filled 2020-12-17 (×4): qty 1

## 2020-12-17 MED ORDER — ALUM & MAG HYDROXIDE-SIMETH 200-200-20 MG/5ML PO SUSP
30.0000 mL | Freq: Four times a day (QID) | ORAL | Status: DC | PRN
  Administered 2020-12-17: 30 mL via ORAL
  Filled 2020-12-17: qty 30

## 2020-12-17 MED ORDER — METHOCARBAMOL 500 MG PO TABS
500.0000 mg | ORAL_TABLET | Freq: Four times a day (QID) | ORAL | Status: DC
  Administered 2020-12-17 (×2): 500 mg via ORAL
  Filled 2020-12-17 (×2): qty 1

## 2020-12-17 MED ORDER — METHOCARBAMOL 500 MG PO TABS: 500.0000 mg | ORAL_TABLET | Freq: Four times a day (QID) | ORAL | 0 refills | Status: DC | PRN

## 2020-12-17 MED ORDER — ACETAMINOPHEN 500 MG PO TABS
1000.0000 mg | ORAL_TABLET | Freq: Four times a day (QID) | ORAL | Status: DC
  Administered 2020-12-17 – 2020-12-19 (×5): 1000 mg via ORAL
  Filled 2020-12-17 (×6): qty 2

## 2020-12-17 MED ORDER — OXYCODONE HCL 5 MG PO TABS: 5.0000 mg | ORAL_TABLET | Freq: Four times a day (QID) | ORAL | 0 refills | Status: DC | PRN

## 2020-12-17 MED ORDER — ACETAMINOPHEN 500 MG PO TABS: 1000.0000 mg | ORAL_TABLET | Freq: Four times a day (QID) | ORAL | 0 refills | Status: DC | PRN

## 2020-12-17 MED ORDER — WARFARIN SODIUM 5 MG PO TABS
10.0000 mg | ORAL_TABLET | Freq: Once | ORAL | Status: AC
  Administered 2020-12-17: 10 mg via ORAL
  Filled 2020-12-17: qty 2

## 2020-12-17 MED ORDER — PHENYLEPHRINE HCL 10 MG PO TABS: 10.0000 mg | ORAL_TABLET | ORAL | Status: DC | PRN

## 2020-12-17 MED ORDER — METHOCARBAMOL 750 MG PO TABS
750.0000 mg | ORAL_TABLET | Freq: Four times a day (QID) | ORAL | Status: DC
  Administered 2020-12-17 – 2020-12-18 (×6): 750 mg via ORAL
  Filled 2020-12-17 (×7): qty 1

## 2020-12-17 MED ORDER — LIDOCAINE 5 % EX PTCH
1.0000 | MEDICATED_PATCH | CUTANEOUS | Status: DC
  Administered 2020-12-17 – 2020-12-18 (×2): 1 via TRANSDERMAL
  Filled 2020-12-17 (×2): qty 1

## 2020-12-17 MED ORDER — FENTANYL CITRATE (PF) 100 MCG/2ML IJ SOLN
12.5000 ug | INTRAMUSCULAR | Status: DC | PRN
Start: 2020-12-17 — End: 2020-12-19

## 2020-12-17 NOTE — Progress Notes (Signed)
1 Day Post-Op  Subjective: Having some pain this morning in RUQ and at incisions with movement.  She has ambulated.  No nausea.  Tolerating her diet.    ROS: See above, otherwise other systems negative  Objective: Vital signs in last 24 hours: Temp:  [97.3 F (36.3 C)-98.7 F (37.1 C)] 97.9 F (36.6 C) (02/22 0720) Pulse Rate:  [56-95] 56 (02/22 0720) Resp:  [14-27] 18 (02/22 0720) BP: (104-128)/(71-85) 108/74 (02/22 0720) SpO2:  [98 %-100 %] 99 % (02/22 0720) Last BM Date: 12/13/20  Intake/Output from previous day: 02/21 0701 - 02/22 0700 In: 1465 [P.O.:240; I.V.:1025; IV Piggyback:200] Out: 20 [Blood:20] Intake/Output this shift: Total I/O In: 480 [P.O.:480] Out: -   PE: Abd: soft, appropriately tender, ice in place currently, +BS, incisions c/d/i  Lab Results:  Recent Labs    12/15/20 1849 12/17/20 0300  WBC 11.5* 10.3  HGB 13.8 13.3  HCT 43.2 42.6  PLT 240 249   BMET Recent Labs    12/15/20 1849 12/17/20 0300  NA 133* 138  K 3.9 4.4  CL 103 104  CO2 22 25  GLUCOSE 98 118*  BUN 7 5*  CREATININE 0.64 0.81  CALCIUM 8.8* 8.7*   PT/INR Recent Labs    12/15/20 2312 12/17/20 0300  LABPROT 17.3* 18.2*  INR 1.5* 1.6*   CMP     Component Value Date/Time   NA 138 12/17/2020 0300   K 4.4 12/17/2020 0300   CL 104 12/17/2020 0300   CO2 25 12/17/2020 0300   GLUCOSE 118 (H) 12/17/2020 0300   BUN 5 (L) 12/17/2020 0300   CREATININE 0.81 12/17/2020 0300   CALCIUM 8.7 (L) 12/17/2020 0300   PROT 7.0 12/17/2020 0300   ALBUMIN 3.4 (L) 12/17/2020 0300   AST 35 12/17/2020 0300   ALT 27 12/17/2020 0300   ALKPHOS 55 12/17/2020 0300   BILITOT 0.4 12/17/2020 0300   GFRNONAA >60 12/17/2020 0300   GFRAA >60 06/21/2020 1759   Lipase     Component Value Date/Time   LIPASE 34 12/15/2020 1849       Studies/Results: CT ABDOMEN PELVIS W CONTRAST  Result Date: 12/16/2020 CLINICAL DATA:  Abdominal pain, fever EXAM: CT ABDOMEN AND PELVIS WITH CONTRAST  TECHNIQUE: Multidetector CT imaging of the abdomen and pelvis was performed using the standard protocol following bolus administration of intravenous contrast. CONTRAST:  75mL OMNIPAQUE IOHEXOL 300 MG/ML  SOLN COMPARISON:  None. FINDINGS: Lower chest: Few bandlike opacities in the left lung base may reflect subsegmental atelectasis and/or scarring. Lung bases otherwise clear. Normal heart size. No pericardial effusion. Hepatobiliary: No worrisome focal liver lesions. Smooth liver surface contour. Normal hepatic attenuation. Moderately distended gallbladder with pericholecystic fluid and multiple noncalcified, partially pneumatized gallstones seen both at the gallbladder tip and towards the gallbladder neck. No significant biliary ductal dilatation is seen. No visible calcified intraductal gallstones. Pancreas: No pancreatic ductal dilatation or surrounding inflammatory changes. Spleen: Normal in size. No concerning splenic lesions. Adrenals/Urinary Tract: Normal adrenal glands. Kidneys are normally located with symmetric enhancement. No suspicious renal lesion, urolithiasis or hydronephrosis. Urinary bladder is unremarkable. Stomach/Bowel: Distal esophagus, stomach and duodenal sweep are unremarkable. No small bowel wall thickening or dilatation. No evidence of obstruction. A normal appendix is visualized. No colonic dilatation or wall thickening. Vascular/Lymphatic: No significant vascular findings are present. No enlarged abdominal or pelvic lymph nodes. Reproductive: Retroflexed uterus, expected positioning of an IUD. Normal follicles seen in the left ovary. Other: Trace low-attenuation free fluid in  the deep pelvis, nonspecific and possibly reactive or physiologic in a reproductive age female. No free air. No bowel containing hernia. Musculoskeletal: No acute osseous abnormality or suspicious osseous lesion. Mild levocurvature of the lumbar levels, apex L4. IMPRESSION: 1. Moderately distended gallbladder with  pericholecystic fluid and multiple noncalcified, partially pneumatized gallstones seen both at the gallbladder tip and towards the gallbladder neck, consistent with acute cholecystitis. No significant biliary ductal dilatation. 2. Trace low-attenuation free fluid in the deep pelvis, nonspecific and possibly reactive or physiologic in a reproductive age female. 3. Retroflexed uterus, expected positioning of an IUD. Electronically Signed   By: Kreg Shropshire M.D.   On: 12/16/2020 00:22    Anti-infectives: Anti-infectives (From admission, onward)   Start     Dose/Rate Route Frequency Ordered Stop   12/17/20 0600  cefTRIAXone (ROCEPHIN) 2 g in sodium chloride 0.9 % 100 mL IVPB  Status:  Discontinued        2 g 200 mL/hr over 30 Minutes Intravenous Every 24 hours 12/16/20 0514 12/16/20 1700   12/16/20 0515  metroNIDAZOLE (FLAGYL) IVPB 500 mg        500 mg 100 mL/hr over 60 Minutes Intravenous Every 8 hours 12/16/20 0514     12/16/20 0100  cefTRIAXone (ROCEPHIN) 2 g in sodium chloride 0.9 % 100 mL IVPB        2 g 200 mL/hr over 30 Minutes Intravenous  Once 12/16/20 0047 12/16/20 0155       Assessment/Plan Lupus anticoagulant on coumadin - ok to resume coumadin from our standpoint  POD 1, s/p lap chole by Dr. Sheliah Hatch 2/21 -doing well.  Pain as expected.  Will increase Tylenol and add some robaxin today, but otherwise surgically stable -regular diet -ok to resume coumadin. -surgically stable for DC home when felt medically stable.  Up to primary if she stays longer to get her INR therapeutic etc. -follow up arranged with our office  FEN - regular diet VTE - heparin gtt, ok to resume coumadin ID - none   LOS: 1 day    Letha Cape , [facility] 12/17/2020, 10:06 AM Please see Amion for pager number during day hours 7:00am-4:30pm or 7:00am -11:30am on weekends

## 2020-12-17 NOTE — Progress Notes (Signed)
ANTICOAGULATION CONSULT NOTE - Follow Up Consult  Pharmacy Consult for heparin Indication: history of antiphospholipid antibody syndrome and PE/infarction  Labs: Recent Labs    12/15/20 1849 12/15/20 2312 12/17/20 0300  HGB 13.8  --  13.3  HCT 43.2  --  42.6  PLT 240  --  249  LABPROT  --  17.3* 18.2*  INR  --  1.5* 1.6*  HEPARINUNFRC  --   --  0.54  CREATININE 0.64  --   --     Assessment/Plan:  25yo female therapeutic on heparin after resumed post-op. Will continue gtt at current rate of 1100 units/hr and confirm stable with additional level.   Vernard Gambles, PharmD, BCPS  12/17/2020,3:53 AM

## 2020-12-17 NOTE — Progress Notes (Signed)
ANTICOAGULATION CONSULT NOTE - Follow Up Consult  Pharmacy Consult for heparin/warfarin Indication: history of antiphospholipid antibody syndrome and PE/infarction  No Known Allergies  Patient Measurements: Height: 5\' 3"  (160 cm) Weight: 74.8 kg (165 lb) (from 2021 records) IBW/kg (Calculated) : 52.4 Heparin Dosing Weight: 70kg  Vital Signs: Temp: 97.9 F (36.6 C) (02/22 0720) Temp Source: Oral (02/22 0720) BP: 108/74 (02/22 0720) Pulse Rate: 56 (02/22 0720)  Labs: Recent Labs    12/15/20 1849 12/15/20 2312 12/17/20 0300 12/17/20 0947  HGB 13.8  --  13.3  --   HCT 43.2  --  42.6  --   PLT 240  --  249  --   LABPROT  --  17.3* 18.2*  --   INR  --  1.5* 1.6*  --   HEPARINUNFRC  --   --  0.54 0.76*  CREATININE 0.64  --  0.81  --     Estimated Creatinine Clearance: 102 mL/min (by C-G formula based on SCr of 0.81 mg/dL).   Medical History: Past Medical History:  Diagnosis Date  . Clotting disorder (HCC)   . Gallstones     Assessment: 25yo female c/o RUQ pain and fever, awaiting surgical consult for acute cholecystitis, to transition from Coumadin to UFH for history of antiphospholipid antibody syndrome, PE, and infarction; current INR below goal w/ last dose of Coumadin taken 2/18.  Heparin level slightly supratherapeutic on 1100 units/hr s/p lap chole.  Also to restart warfarin at this time, plan for bridge with lovenox at discharge.  INR 1.6 today  PTA dosing: 7.5mg  daily except MWF 10mg   Goal of Therapy:  Heparin level 0.3-0.7 units/ml Monitor platelets by anticoagulation protocol: Yes   Plan:  Decrease heparin gtt to 1000 units/hr F/u 6 hour heparin level Warfarin 10mg  PO x 1 today Daily INR, s/s bleeding  3/18, PharmD Clinical Pharmacist Please check AMION for all [facility] Pharmacy numbers 12/17/2020 10:52 AM

## 2020-12-17 NOTE — Progress Notes (Signed)
Progress Note    Alicia Mendez  JJK:[id number] DOB: [birth year]  DOA: 12/15/2020 PCP: Arther Abbott, MD   Brief Narrative:   Chief complaint: Abdominal pain, fever.  Medical records reviewed and are as summarized below:  Alicia Mendez is an 25 y.o. female with a PMH of antiphospholipid syndrome, DVT and PE on Coumadin, who presented to the ED with complaints of severe abdominal pain and fever. WBC 11.5 and CT consistent with acute cholecystitis. Surgery consulted.  Underwent lap chole 2/21 afternoon  Assessment/Plan:   Principal Problem:   Acute cholecystitis due to biliary calculus, POA -Underwent lap chole yesterday afternoon,  -Restarted on heparin drip overnight -INR is 1.6, start Coumadin today -Ambulate, discharge planning  Active Problems:   Anti-cardiolipin antibody positive, chronic, with h/o PE/DVT in 2020 -Currently on heparin drip, starting Coumadin -Will transition to Lovenox bridge with Coumadin at discharge, she has used Lovenox in the past during her pregnancies    Hyponatremia, POA -resolved    DVT prophylaxis: IV heparin  Code Status: Full Code.  Family Communication: No family at the bedside. Disposition Plan: Status is: Inpatient  Remains inpatient appropriate because:  severe pain, restarting Coumadin today  Dispo: The patient is from: Home              Anticipated d/c is to: Home              Anticipated d/c date is: Likely tomorrow              Patient currently is not medically stable to d/c.   Difficult to place patient No    Medical Consultants:    Surgery  Procedure Lap chole with IOC 2/21 Dr. Sheliah Hatch   Subjective:   -Reports 2 episodes of severe abdominal pain this morning -Had juice and coconut water earlier Objective:    Vitals:   12/16/20 1924 12/16/20 2309 12/17/20 0318 12/17/20 0720  BP: 117/79 111/72 118/79 108/74  Pulse: 83 70 67 (!) 56  Resp: 20 18 20 18   Temp: 98.7 F (37.1 C) 97.8 F (36.6 C)  97.7 F (36.5 C) 97.9 F (36.6 C)  TempSrc: Oral Oral Oral Oral  SpO2: 100% 98% 99% 99%  Weight:      Height:        Intake/Output Summary (Last 24 hours) at 12/17/2020 1026 Last data filed at 12/17/2020 12/19/2020 Gross per 24 hour  Intake 1820 ml  Output 20 ml  Net 1800 ml   Filed Weights   12/16/20 0500  Weight: 74.8 kg    Exam: General: Pleasant young female sitting up in bed, uncomfortable appearing, AAOx3 CVS: S1-S2, regular rate rhythm Lungs: Decreased breath sounds the bases otherwise clear Abdomen: Soft, mildly distended, tender as expected, port sites unremarkable, bowel sounds decreased Extremities: No edema      Data Reviewed:   I have personally reviewed following labs and imaging studies:  Labs: Labs show the following:   Basic Metabolic Panel: Recent Labs  Lab 12/15/20 1849 12/17/20 0300  NA 133* 138  K 3.9 4.4  CL 103 104  CO2 22 25  GLUCOSE 98 118*  BUN 7 5*  CREATININE 0.64 0.81  CALCIUM 8.8* 8.7*   GFR Estimated Creatinine Clearance: 102 mL/min (by C-G formula based on SCr of 0.81 mg/dL). Liver Function Tests: Recent Labs  Lab 12/15/20 1849 12/17/20 0300  AST 15 35  ALT 13 27  ALKPHOS 51 55  BILITOT 0.5 0.4  PROT 6.9 7.0  ALBUMIN 3.9 3.4*   Recent Labs  Lab 12/15/20 1849  LIPASE 34   Coagulation profile Recent Labs  Lab 12/15/20 2312 12/17/20 0300  INR 1.5* 1.6*    CBC: Recent Labs  Lab 12/15/20 1849 12/17/20 0300  WBC 11.5* 10.3  HGB 13.8 13.3  HCT 43.2 42.6  MCV 79.9* 79.6*  PLT 240 249    Microbiology Recent Results (from the past 240 hour(s))  Resp Panel by RT-PCR (Flu A&B, Covid) Nasopharyngeal Swab     Status: None   Collection Time: 12/16/20  1:30 AM   Specimen: Nasopharyngeal Swab; Nasopharyngeal(NP) swabs in vial transport medium  Result Value Ref Range Status   SARS Coronavirus 2 by RT PCR NEGATIVE NEGATIVE Final    Comment: (NOTE) SARS-CoV-2 target nucleic acids are NOT DETECTED.  The SARS-CoV-2  RNA is generally detectable in upper respiratory specimens during the acute phase of infection. The lowest concentration of SARS-CoV-2 viral copies this assay can detect is 138 copies/mL. A negative result does not preclude SARS-Cov-2 infection and should not be used as the sole basis for treatment or other patient management decisions. A negative result may occur with  improper specimen collection/handling, submission of specimen other than nasopharyngeal swab, presence of viral mutation(s) within the areas targeted by this assay, and inadequate number of viral copies(<138 copies/mL). A negative result must be combined with clinical observations, patient history, and epidemiological information. The expected result is Negative.  Fact Sheet for Patients:  BloggerCourse.com  Fact Sheet for Healthcare Providers:  SeriousBroker.it  This test is no t yet approved or cleared by the [place] FDA and  has been authorized for detection and/or diagnosis of SARS-CoV-2 by FDA under an Emergency Use Authorization (EUA). This EUA will remain  in effect (meaning this test can be used) for the duration of the COVID-19 declaration under Section 564(b)(1) of the Act, 21 U.S.C.section 360bbb-3(b)(1), unless the authorization is terminated  or revoked sooner.       Influenza A by PCR NEGATIVE NEGATIVE Final   Influenza B by PCR NEGATIVE NEGATIVE Final    Comment: (NOTE) The Xpert Xpress SARS-CoV-2/FLU/RSV plus assay is intended as an aid in the diagnosis of influenza from Nasopharyngeal swab specimens and should not be used as a sole basis for treatment. Nasal washings and aspirates are unacceptable for Xpert Xpress SARS-CoV-2/FLU/RSV testing.  Fact Sheet for Patients: BloggerCourse.com  Fact Sheet for Healthcare Providers: SeriousBroker.it  This test is not yet approved or cleared by the  [place] FDA and has been authorized for detection and/or diagnosis of SARS-CoV-2 by FDA under an Emergency Use Authorization (EUA). This EUA will remain in effect (meaning this test can be used) for the duration of the COVID-19 declaration under Section 564(b)(1) of the Act, 21 U.S.C. section 360bbb-3(b)(1), unless the authorization is terminated or revoked.  Performed at [facility], [address], [place], [place] [address]     Procedures and diagnostic studies:  CT ABDOMEN PELVIS W CONTRAST  Result Date: 12/16/2020 CLINICAL DATA:  Abdominal pain, fever EXAM: CT ABDOMEN AND PELVIS WITH CONTRAST TECHNIQUE: Multidetector CT imaging of the abdomen and pelvis was performed using the standard protocol following bolus administration of intravenous contrast. CONTRAST:  67mL OMNIPAQUE IOHEXOL 300 MG/ML  SOLN COMPARISON:  None. FINDINGS: Lower chest: Few bandlike opacities in the left lung base may reflect subsegmental atelectasis and/or scarring. Lung bases otherwise clear. Normal heart size. No pericardial effusion. Hepatobiliary: No worrisome focal liver lesions. Smooth liver surface contour.  Normal hepatic attenuation. Moderately distended gallbladder with pericholecystic fluid and multiple noncalcified, partially pneumatized gallstones seen both at the gallbladder tip and towards the gallbladder neck. No significant biliary ductal dilatation is seen. No visible calcified intraductal gallstones. Pancreas: No pancreatic ductal dilatation or surrounding inflammatory changes. Spleen: Normal in size. No concerning splenic lesions. Adrenals/Urinary Tract: Normal adrenal glands. Kidneys are normally located with symmetric enhancement. No suspicious renal lesion, urolithiasis or hydronephrosis. Urinary bladder is unremarkable. Stomach/Bowel: Distal esophagus, stomach and duodenal sweep are unremarkable. No small bowel wall thickening or dilatation. No evidence of obstruction. A normal appendix  is visualized. No colonic dilatation or wall thickening. Vascular/Lymphatic: No significant vascular findings are present. No enlarged abdominal or pelvic lymph nodes. Reproductive: Retroflexed uterus, expected positioning of an IUD. Normal follicles seen in the left ovary. Other: Trace low-attenuation free fluid in the deep pelvis, nonspecific and possibly reactive or physiologic in a reproductive age female. No free air. No bowel containing hernia. Musculoskeletal: No acute osseous abnormality or suspicious osseous lesion. Mild levocurvature of the lumbar levels, apex L4. IMPRESSION: 1. Moderately distended gallbladder with pericholecystic fluid and multiple noncalcified, partially pneumatized gallstones seen both at the gallbladder tip and towards the gallbladder neck, consistent with acute cholecystitis. No significant biliary ductal dilatation. 2. Trace low-attenuation free fluid in the deep pelvis, nonspecific and possibly reactive or physiologic in a reproductive age female. 3. Retroflexed uterus, expected positioning of an IUD. Electronically Signed   By: Kreg Shropshire M.D.   On: 12/16/2020 00:22    Medications:   . acetaminophen  1,000 mg Oral Q6H  . methocarbamol  500 mg Oral QID   Continuous Infusions: . heparin 1,100 Units/hr (12/16/20 2133)     LOS: 1 day   Zannie Cove, MD  [facility]   [facility]  12/17/2020, 10:26 AM

## 2020-12-17 NOTE — Anesthesia Postprocedure Evaluation (Signed)
Anesthesia Post Note  Patient: Alicia Mendez  Procedure(s) Performed: LAPAROSCOPIC CHOLECYSTECTOMY (N/A Abdomen)     Patient location during evaluation: PACU Anesthesia Type: General Level of consciousness: awake and alert Pain management: pain level controlled Vital Signs Assessment: post-procedure vital signs reviewed and stable Respiratory status: spontaneous breathing, nonlabored ventilation, respiratory function stable and patient connected to nasal cannula oxygen Cardiovascular status: blood pressure returned to baseline and stable Postop Assessment: no apparent nausea or vomiting Anesthetic complications: no   No complications documented.  Last Vitals:  Vitals:   12/17/20 0318 12/17/20 0720  BP: 118/79 108/74  Pulse: 67 (!) 56  Resp: 20 18  Temp: 36.5 C 36.6 C  SpO2: 99% 99%    Last Pain:  Vitals:   12/17/20 0720  TempSrc: Oral  PainSc:                  Evangaline Jou S

## 2020-12-17 NOTE — Progress Notes (Signed)
ANTICOAGULATION CONSULT NOTE - Follow Up Consult  Pharmacy Consult for heparin/warfarin Indication: history of antiphospholipid antibody syndrome and PE/infarction  No Known Allergies  Patient Measurements: Height: 5\' 3"  (160 cm) Weight: 74.8 kg (165 lb) (from 2021 records) IBW/kg (Calculated) : 52.4 Heparin Dosing Weight: 70kg  Vital Signs: Temp: 98 F (36.7 C) (02/22 1649) Temp Source: Oral (02/22 1649) BP: 101/53 (02/22 1649) Pulse Rate: 63 (02/22 1649)  Labs: Recent Labs    12/15/20 1849 12/15/20 2312 12/17/20 0300 12/17/20 0947 12/17/20 1800  HGB 13.8  --  13.3  --   --   HCT 43.2  --  42.6  --   --   PLT 240  --  249  --   --   LABPROT  --  17.3* 18.2*  --   --   INR  --  1.5* 1.6*  --   --   HEPARINUNFRC  --   --  0.54 0.76* 0.64  CREATININE 0.64  --  0.81  --   --     Estimated Creatinine Clearance: 102 mL/min (by C-G formula based on SCr of 0.81 mg/dL).   Medical History: Past Medical History:  Diagnosis Date  . Clotting disorder (HCC)   . Gallstones     Assessment: 25yo female c/o RUQ pain and fever, awaiting surgical consult for acute cholecystitis, to transition from Coumadin to UFH for history of antiphospholipid antibody syndrome, PE, and infarction; current INR below goal w/ last dose of Coumadin taken 2/18.  Heparin level now therapeutic at 0.64, warfarin resumed this am, CBC wnl.  PTA dosing: 7.5mg  daily except MWF 10mg   Goal of Therapy:  Heparin level 0.3-0.7 units/ml Monitor platelets by anticoagulation protocol: Yes   Plan:  Continue heparin 1000 units/h Daily heparin level, INR, CBC   3/18, PharmD, BCPS, [facility] Clinical Pharmacist [phone] Please check AMION for all [facility] Pharmacy numbers 12/17/2020

## 2020-12-18 DIAGNOSIS — K8000 Calculus of gallbladder with acute cholecystitis without obstruction: Secondary | ICD-10-CM | POA: Diagnosis not present

## 2020-12-18 DIAGNOSIS — R760 Raised antibody titer: Secondary | ICD-10-CM | POA: Diagnosis not present

## 2020-12-18 DIAGNOSIS — E871 Hypo-osmolality and hyponatremia: Secondary | ICD-10-CM

## 2020-12-18 DIAGNOSIS — Z86718 Personal history of other venous thrombosis and embolism: Secondary | ICD-10-CM | POA: Diagnosis not present

## 2020-12-18 DIAGNOSIS — Z86711 Personal history of pulmonary embolism: Secondary | ICD-10-CM | POA: Diagnosis not present

## 2020-12-18 LAB — COMPREHENSIVE METABOLIC PANEL
ALT: 23 U/L (ref 0–44)
AST: 22 U/L (ref 15–41)
Albumin: 3 g/dL — ABNORMAL LOW (ref 3.5–5.0)
Alkaline Phosphatase: 42 U/L (ref 38–126)
Anion gap: 9 (ref 5–15)
BUN: 5 mg/dL — ABNORMAL LOW (ref 6–20)
CO2: 25 mmol/L (ref 22–32)
Calcium: 8.2 mg/dL — ABNORMAL LOW (ref 8.9–10.3)
Chloride: 102 mmol/L (ref 98–111)
Creatinine, Ser: 0.8 mg/dL (ref 0.44–1.00)
GFR, Estimated: >60 mL/min (ref >60)
Glucose, Bld: 92 mg/dL (ref 70–99)
Potassium: 3.7 mmol/L (ref 3.5–5.1)
Sodium: 136 mmol/L (ref 135–145)
Total Bilirubin: 0.4 mg/dL (ref 0.3–1.2)
Total Protein: 5.6 g/dL — ABNORMAL LOW (ref 6.5–8.1)

## 2020-12-18 LAB — HEPARIN LEVEL (UNFRACTIONATED): Heparin Unfractionated: 0.67 IU/mL (ref 0.30–0.70)

## 2020-12-18 LAB — CBC
HCT: 36.1 % (ref 36.0–46.0)
Hemoglobin: 11.4 g/dL — ABNORMAL LOW (ref 12.0–15.0)
MCH: 25 pg — ABNORMAL LOW (ref 26.0–34.0)
MCHC: 31.6 g/dL (ref 30.0–36.0)
MCV: 79.2 fL — ABNORMAL LOW (ref 80.0–100.0)
Platelets: 175 10*3/uL (ref 150–400)
RBC: 4.56 MIL/uL (ref 3.87–5.11)
RDW: 14.5 % (ref 11.5–15.5)
WBC: 9.1 10*3/uL (ref 4.0–10.5)
nRBC: 0 % (ref 0.0–0.2)

## 2020-12-18 LAB — PROTIME-INR
INR: 1.6 — ABNORMAL HIGH (ref 0.8–1.2)
Prothrombin Time: 18.7 seconds — ABNORMAL HIGH (ref 11.4–15.2)

## 2020-12-18 MED ORDER — WARFARIN SODIUM 5 MG PO TABS
10.0000 mg | ORAL_TABLET | Freq: Once | ORAL | Status: AC
  Administered 2020-12-18: 10 mg via ORAL
  Filled 2020-12-18: qty 2

## 2020-12-18 MED ORDER — ENOXAPARIN SODIUM 120 MG/0.8ML ~~LOC~~ SOLN
110.0000 mg | SUBCUTANEOUS | Status: DC
  Administered 2020-12-18: 110 mg via SUBCUTANEOUS
  Filled 2020-12-18 (×2): qty 0.74

## 2020-12-18 MED ORDER — POLYETHYLENE GLYCOL 3350 17 G PO PACK
17.0000 g | PACK | Freq: Two times a day (BID) | ORAL | Status: DC
  Administered 2020-12-18 (×2): 17 g via ORAL
  Filled 2020-12-18 (×3): qty 1

## 2020-12-18 NOTE — Progress Notes (Signed)
[facility] PROGRESS NOTE    Progress Note  Alicia Mendez  PYP:[id number] DOB: 1996-03-12 DOA: 12/15/2020 PCP: Alm Bustard, MD     Brief Narrative:   Alicia Mendez is an 25 y.o. female with a PMH ofantiphospholipid syndrome, DVT and PE on Coumadin, who presented to the ED with complaints of severe abdominal pain and fever. WBC 11.5 and CT consistent with acute cholecystitis. Surgery consulted.  Underwent lap chole 2/21 afternoon    Assessment/Plan:   Acute cholecystitis due to biliary calculus Underwent lap chole on 12/16/2020. Restarted on her heparin drip. INR this morning is continue heparin and Coumadin bridging. He did overlap with IV heparin and Coumadin on two and INR is greater than two. Patient has minimal appetite has not had a bowel movement in 5 days.  Started on MiraLAX.  Anti-cardiolipin antibody positive Continue heparin and Coumadin can transition her to Lovenox and Coumadin.  Hypovolemic hyponatremia Resolved with IV fluid hydration.  Microcytic anemia: Mild drop in hemoglobin from 13-11.4 question hemodilution with no signs of bleeding.   DVT prophylaxis: Lovenox and Coumadin Family Communication:none Status is: Inpatient  Remains inpatient appropriate because:Hemodynamically unstable   Dispo: The patient is from: Home              Anticipated d/c is to: Home              Anticipated d/c date is: 1 day              Patient currently is not medically stable to d/c.   Difficult to place patient No        Code Status:     Code Status Orders  (From admission, onward)         Start     Ordered   12/16/20 0520  Full code  Continuous        12/16/20 0520        Code Status History    This patient has a current code status but no historical code status.   Advance Care Planning Activity        IV Access:    Peripheral IV   Procedures and diagnostic studies:   No results found.   Medical Consultants:     None.   Subjective:    Nash Mantis abdominal pain and no appetite.  Objective:    Vitals:   12/17/20 1649 12/17/20 2052 12/18/20 0206 12/18/20 0659  BP: (!) 101/53 115/71 102/60 107/65  Pulse: 63 66 (!) 59 61  Resp: 14 17 18 17   Temp: 98 F (36.7 C) 98 F (36.7 C) 98.6 F (37 C) 98.8 F (37.1 C)  TempSrc: Oral Oral Oral Oral  SpO2: 98% 98% 98% 100%  Weight:      Height:       SpO2: 100 %   Intake/Output Summary (Last 24 hours) at 12/18/2020 1122 Last data filed at 12/18/2020 1045 Gross per 24 hour  Intake 785.94 ml  Output --  Net 785.94 ml   Filed Weights   12/16/20 0500  Weight: 74.8 kg    Exam: General exam: In no acute distress. Respiratory system: Good air movement and clear to auscultation. Cardiovascular system: S1 & S2 heard, RRR. No JVD. Gastrointestinal system: Abdomen is nondistended, soft and nontender.  Extremities: No pedal edema. Skin: No rashes, lesions or ulcers Psychiatry: Judgement and insight appear normal. Mood & affect appropriate.    Data Reviewed:    Labs: Basic Metabolic Panel: Recent  Labs  Lab 12/15/20 1849 12/17/20 0300 12/18/20 0206  NA 133* 138 136  K 3.9 4.4 3.7  CL 103 104 102  CO2 22 25 25   GLUCOSE 98 118* 92  BUN 7 5* 5*  CREATININE 0.64 0.81 0.80  CALCIUM 8.8* 8.7* 8.2*   GFR Estimated Creatinine Clearance: 103.3 mL/min (by C-G formula based on SCr of 0.8 mg/dL). Liver Function Tests: Recent Labs  Lab 12/15/20 1849 12/17/20 0300 12/18/20 0206  AST 15 35 22  ALT 13 27 23   ALKPHOS 51 55 42  BILITOT 0.5 0.4 0.4  PROT 6.9 7.0 5.6*  ALBUMIN 3.9 3.4* 3.0*   Recent Labs  Lab 12/15/20 1849  LIPASE 34   No results for input(s): AMMONIA in the last 168 hours. Coagulation profile Recent Labs  Lab 12/15/20 2312 12/17/20 0300 12/18/20 0206  INR 1.5* 1.6* 1.6*   COVID-19 Labs  No results for input(s): DDIMER, FERRITIN, LDH, CRP in the last 72 hours.  Lab Results  Component Value  Date   SARSCOV2NAA NEGATIVE 12/16/2020   SARSCOV2NAA NEGATIVE 12/07/2019    CBC: Recent Labs  Lab 12/15/20 1849 12/17/20 0300 12/18/20 0206  WBC 11.5* 10.3 9.1  HGB 13.8 13.3 11.4*  HCT 43.2 42.6 36.1  MCV 79.9* 79.6* 79.2*  PLT 240 249 175   Cardiac Enzymes: No results for input(s): CKTOTAL, CKMB, CKMBINDEX, TROPONINI in the last 168 hours. BNP (last 3 results) No results for input(s): PROBNP in the last 8760 hours. CBG: No results for input(s): GLUCAP in the last 168 hours. D-Dimer: No results for input(s): DDIMER in the last 72 hours. Hgb A1c: No results for input(s): HGBA1C in the last 72 hours. Lipid Profile: No results for input(s): CHOL, HDL, LDLCALC, TRIG, CHOLHDL, LDLDIRECT in the last 72 hours. Thyroid function studies: No results for input(s): TSH, T4TOTAL, T3FREE, THYROIDAB in the last 72 hours.  Invalid input(s): FREET3 Anemia work up: No results for input(s): VITAMINB12, FOLATE, FERRITIN, TIBC, IRON, RETICCTPCT in the last 72 hours. Sepsis Labs: Recent Labs  Lab 12/15/20 1849 12/17/20 0300 12/18/20 0206  WBC 11.5* 10.3 9.1   Microbiology Recent Results (from the past 240 hour(s))  Resp Panel by RT-PCR (Flu A&B, Covid) Nasopharyngeal Swab     Status: None   Collection Time: 12/16/20  1:30 AM   Specimen: Nasopharyngeal Swab; Nasopharyngeal(NP) swabs in vial transport medium  Result Value Ref Range Status   SARS Coronavirus 2 by RT PCR NEGATIVE NEGATIVE Final    Comment: (NOTE) SARS-CoV-2 target nucleic acids are NOT DETECTED.  The SARS-CoV-2 RNA is generally detectable in upper respiratory specimens during the acute phase of infection. The lowest concentration of SARS-CoV-2 viral copies this assay can detect is 138 copies/mL. A negative result does not preclude SARS-Cov-2 infection and should not be used as the sole basis for treatment or other patient management decisions. A negative result may occur with  improper specimen collection/handling,  submission of specimen other than nasopharyngeal swab, presence of viral mutation(s) within the areas targeted by this assay, and inadequate number of viral copies(<138 copies/mL). A negative result must be combined with clinical observations, patient history, and epidemiological information. The expected result is Negative.  Fact Sheet for Patients:  12/20/20  Fact Sheet for Healthcare Providers:  12/18/20  This test is no t yet approved or cleared by the [place] FDA and  has been authorized for detection and/or diagnosis of SARS-CoV-2 by FDA under an Emergency Use Authorization (EUA). This EUA will remain  in  effect (meaning this test can be used) for the duration of the COVID-19 declaration under Section 564(b)(1) of the Act, 21 U.S.C.section 360bbb-3(b)(1), unless the authorization is terminated  or revoked sooner.       Influenza A by PCR NEGATIVE NEGATIVE Final   Influenza B by PCR NEGATIVE NEGATIVE Final    Comment: (NOTE) The Xpert Xpress SARS-CoV-2/FLU/RSV plus assay is intended as an aid in the diagnosis of influenza from Nasopharyngeal swab specimens and should not be used as a sole basis for treatment. Nasal washings and aspirates are unacceptable for Xpert Xpress SARS-CoV-2/FLU/RSV testing.  Fact Sheet for Patients: BloggerCourse.com  Fact Sheet for Healthcare Providers: SeriousBroker.it  This test is not yet approved or cleared by the [place] FDA and has been authorized for detection and/or diagnosis of SARS-CoV-2 by FDA under an Emergency Use Authorization (EUA). This EUA will remain in effect (meaning this test can be used) for the duration of the COVID-19 declaration under Section 564(b)(1) of the Act, 21 U.S.C. section 360bbb-3(b)(1), unless the authorization is terminated or revoked.  Performed at [facility], [address] [place], [place] [address]      Medications:   . acetaminophen  1,000 mg Oral Q6H  . lidocaine  1 patch Transdermal Q24H  . methocarbamol  750 mg Oral QID  . warfarin  10 mg Oral ONCE-1600  . Warfarin - Pharmacist Dosing Inpatient   Does not apply q1600   Continuous Infusions: . heparin 950 Units/hr (12/18/20 0951)      LOS: 2 days   Marinda Elk  [facility]  12/18/2020, 11:22 AM

## 2020-12-18 NOTE — Progress Notes (Addendum)
ANTICOAGULATION CONSULT NOTE - Follow Up Consult  Pharmacy Consult for heparin/warfarin (Addendum: Heparin >> Lovenox) Indication: history of antiphospholipid antibody syndrome and PE/infarction  No Known Allergies  Patient Measurements: Height: 5\' 3"  (160 cm) Weight: 74.8 kg (165 lb) (from 2021 records) IBW/kg (Calculated) : 52.4 Heparin Dosing Weight: 70kg  Vital Signs: Temp: 98.8 F (37.1 C) (02/23 0659) Temp Source: Oral (02/23 0659) BP: 107/65 (02/23 0659) Pulse Rate: 61 (02/23 0659)  Labs: Recent Labs    12/15/20 1849 12/15/20 1849 12/15/20 2312 12/17/20 0300 12/17/20 0947 12/17/20 1800 12/18/20 0206  HGB 13.8  --   --  13.3  --   --  11.4*  HCT 43.2  --   --  42.6  --   --  36.1  PLT 240  --   --  249  --   --  175  LABPROT  --   --  17.3* 18.2*  --   --  18.7*  INR  --   --  1.5* 1.6*  --   --  1.6*  HEPARINUNFRC  --    < >  --  0.54 0.76* 0.64 0.67  CREATININE 0.64  --   --  0.81  --   --  0.80   < > = values in this interval not displayed.    Estimated Creatinine Clearance: 103.3 mL/min (by C-G formula based on SCr of 0.8 mg/dL).   Medical History: Past Medical History:  Diagnosis Date  . Clotting disorder (HCC)   . Gallstones     Assessment: 25yo female c/o RUQ pain and fever, awaiting surgical consult for acute cholecystitis, to transition from Coumadin to UFH for history of antiphospholipid antibody syndrome, PE, and infarction; current INR below goal w/ last dose of Coumadin taken 2/18.  Heparin level this morning remains therapeutic but trended towards the higher end of goal range (HL 0.67 << 0.64, goal of 0.3-0.7). INR remains SUBtherapeutic (INR 1.6 << 1.6, goal of 2-3). Hgb drop to 11.4 << 13.3 - no overt bleeding noted, will monitor.   PTA dosing: 7.5mg  daily except MWF 10mg   Goal of Therapy:  Heparin level 0.3-0.7 units/ml Monitor platelets by anticoagulation protocol: Yes   Plan:  - Decrease Heparin drip slightly to 950 units/hr -  Warfarin 10 mg x 1 dose at 1600 today - Will continue to monitor for any signs/symptoms of bleeding and will follow up with heparin level and PT/INR in the a.m.   Addendum: The plan is to go ahead and transition the patient to lovenox for bridging to therapeutic INR with warfarin. MD requested 1.5 mg/kg dosing.   Plan - D/c Heparin - Start Lovenox 110 mg/day (1.5 mg/kg) for bridging until INR>2 12/18/2020 11:37 AM  Thank you for allowing pharmacy to be a part of this patient's care.  , PharmD, BCPS Clinical Pharmacist Clinical phone for 12/18/2020: Georgina Pillion 12/18/2020 9:10 AM   **Pharmacist phone directory can now be found on [email or web address] ([email or web address]).  Listed under [facility].

## 2020-12-18 NOTE — Progress Notes (Signed)
2 Days Post-Op  Subjective: Pain slightly improved - still sore. +flatus but denies BM. Tolerating PO. Denies CP, lightheadedness, palpitation, or urinary sxs.   ROS: See above, otherwise other systems negative  Objective: Vital signs in last 24 hours: Temp:  [98 F (36.7 C)-98.8 F (37.1 C)] 98.8 F (37.1 C) (02/23 0659) Pulse Rate:  [59-66] 61 (02/23 0659) Resp:  [14-18] 17 (02/23 0659) BP: (101-121)/(53-81) 107/65 (02/23 0659) SpO2:  [98 %-100 %] 100 % (02/23 0659) Last BM Date: 12/13/20  Intake/Output from previous day: 02/22 0701 - 02/23 0700 In: 1085.9 [P.O.:780; I.V.:305.9] Out: -  Intake/Output this shift: No intake/output data recorded.  PE: Abd: soft, appropriately tender,  +BS, incisions c/d/i  Lab Results:  Recent Labs    12/17/20 0300 12/18/20 0206  WBC 10.3 9.1  HGB 13.3 11.4*  HCT 42.6 36.1  PLT 249 175   BMET Recent Labs    12/17/20 0300 12/18/20 0206  NA 138 136  K 4.4 3.7  CL 104 102  CO2 25 25  GLUCOSE 118* 92  BUN 5* 5*  CREATININE 0.81 0.80  CALCIUM 8.7* 8.2*   PT/INR Recent Labs    12/17/20 0300 12/18/20 0206  LABPROT 18.2* 18.7*  INR 1.6* 1.6*   CMP     Component Value Date/Time   NA 136 12/18/2020 0206   K 3.7 12/18/2020 0206   CL 102 12/18/2020 0206   CO2 25 12/18/2020 0206   GLUCOSE 92 12/18/2020 0206   BUN 5 (L) 12/18/2020 0206   CREATININE 0.80 12/18/2020 0206   CALCIUM 8.2 (L) 12/18/2020 0206   PROT 5.6 (L) 12/18/2020 0206   ALBUMIN 3.0 (L) 12/18/2020 0206   AST 22 12/18/2020 0206   ALT 23 12/18/2020 0206   ALKPHOS 42 12/18/2020 0206   BILITOT 0.4 12/18/2020 0206   GFRNONAA >60 12/18/2020 0206   GFRAA >60 06/21/2020 1759   Lipase     Component Value Date/Time   LIPASE 34 12/15/2020 1849       Studies/Results: No results found.  Anti-infectives: Anti-infectives (From admission, onward)   Start     Dose/Rate Route Frequency Ordered Stop   12/17/20 0600  cefTRIAXone (ROCEPHIN) 2 g in sodium  chloride 0.9 % 100 mL IVPB  Status:  Discontinued        2 g 200 mL/hr over 30 Minutes Intravenous Every 24 hours 12/16/20 0514 12/16/20 1700   12/16/20 0515  metroNIDAZOLE (FLAGYL) IVPB 500 mg  Status:  Discontinued        500 mg 100 mL/hr over 60 Minutes Intravenous Every 8 hours 12/16/20 0514 12/17/20 1010   12/16/20 0100  cefTRIAXone (ROCEPHIN) 2 g in sodium chloride 0.9 % 100 mL IVPB        2 g 200 mL/hr over 30 Minutes Intravenous  Once 12/16/20 0047 12/16/20 0155       Assessment/Plan Lupus anticoagulant on coumadin - ok to resume coumadin from our standpoint  POD 2, s/p lap chole by Dr. Sheliah Hatch 2/21 -doing well.  Pain as expected. surgically stable with no signs/sxs post-op bleeding. hgb 11.4 from 13.3 -regular diet -INR 1.6, continue coumadin/hep gtt per primary team -surgically stable for DC home when felt medically stable. -follow up arranged with our office and work note provided  FEN - regular diet VTE - heparin gtt, coumadin ID - none   LOS: 2 days    Adam Phenix , [facility] 12/18/2020, 9:23 AM Please see Amion for pager  number during day hours 7:00am-4:30pm or 7:00am -11:30am on weekends

## 2020-12-19 ENCOUNTER — Other Ambulatory Visit: Payer: Self-pay | Admitting: Internal Medicine

## 2020-12-19 DIAGNOSIS — Z86718 Personal history of other venous thrombosis and embolism: Secondary | ICD-10-CM

## 2020-12-19 DIAGNOSIS — Z86711 Personal history of pulmonary embolism: Secondary | ICD-10-CM

## 2020-12-19 DIAGNOSIS — R760 Raised antibody titer: Secondary | ICD-10-CM | POA: Diagnosis not present

## 2020-12-19 DIAGNOSIS — K8000 Calculus of gallbladder with acute cholecystitis without obstruction: Secondary | ICD-10-CM | POA: Diagnosis not present

## 2020-12-19 DIAGNOSIS — E871 Hypo-osmolality and hyponatremia: Secondary | ICD-10-CM | POA: Diagnosis not present

## 2020-12-19 LAB — COMPREHENSIVE METABOLIC PANEL
ALT: 20 U/L (ref 0–44)
AST: 19 U/L (ref 15–41)
Albumin: 3.1 g/dL — ABNORMAL LOW (ref 3.5–5.0)
Alkaline Phosphatase: 46 U/L (ref 38–126)
Anion gap: 10 (ref 5–15)
BUN: <5 mg/dL — ABNORMAL LOW (ref 6–20)
CO2: 24 mmol/L (ref 22–32)
Calcium: 8.3 mg/dL — ABNORMAL LOW (ref 8.9–10.3)
Chloride: 102 mmol/L (ref 98–111)
Creatinine, Ser: 0.84 mg/dL (ref 0.44–1.00)
GFR, Estimated: >60 mL/min (ref >60)
Glucose, Bld: 80 mg/dL (ref 70–99)
Potassium: 3.8 mmol/L (ref 3.5–5.1)
Sodium: 136 mmol/L (ref 135–145)
Total Bilirubin: 0.2 mg/dL — ABNORMAL LOW (ref 0.3–1.2)
Total Protein: 5.8 g/dL — ABNORMAL LOW (ref 6.5–8.1)

## 2020-12-19 LAB — PROTIME-INR
INR: 2.3 — ABNORMAL HIGH (ref 0.8–1.2)
Prothrombin Time: 24.4 seconds — ABNORMAL HIGH (ref 11.4–15.2)

## 2020-12-19 LAB — CBC
HCT: 37.6 % (ref 36.0–46.0)
Hemoglobin: 11.8 g/dL — ABNORMAL LOW (ref 12.0–15.0)
MCH: 25.2 pg — ABNORMAL LOW (ref 26.0–34.0)
MCHC: 31.4 g/dL (ref 30.0–36.0)
MCV: 80.3 fL (ref 80.0–100.0)
Platelets: 199 10*3/uL (ref 150–400)
RBC: 4.68 MIL/uL (ref 3.87–5.11)
RDW: 14.5 % (ref 11.5–15.5)
WBC: 6.4 10*3/uL (ref 4.0–10.5)
nRBC: 0 % (ref 0.0–0.2)

## 2020-12-19 MED ORDER — OXYCODONE HCL 5 MG PO TABS: 5.0000 mg | ORAL_TABLET | Freq: Four times a day (QID) | ORAL | 0 refills | Status: DC | PRN

## 2020-12-19 MED ORDER — METHOCARBAMOL 500 MG PO TABS: 500.0000 mg | ORAL_TABLET | Freq: Four times a day (QID) | ORAL | 0 refills | Status: DC | PRN

## 2020-12-19 MED ORDER — SUMATRIPTAN SUCCINATE 100 MG PO TABS: 100.0000 mg | ORAL_TABLET | Freq: Every day | ORAL | 0 refills | Status: DC | PRN

## 2020-12-19 MED ORDER — GUAIFENESIN 100 MG/5ML PO SOLN
5.0000 mL | ORAL | Status: DC | PRN
  Filled 2020-12-19: qty 5

## 2020-12-19 MED ORDER — WARFARIN SODIUM 5 MG PO TABS: 5.0000 mg | ORAL_TABLET | ORAL | 0 refills | Status: DC

## 2020-12-19 MED ORDER — POLYETHYLENE GLYCOL 3350 17 G PO PACK: 17.0000 g | PACK | Freq: Every day | ORAL | 0 refills | Status: DC

## 2020-12-19 MED ORDER — ENOXAPARIN SODIUM 120 MG/0.8ML ~~LOC~~ SOLN: 110.0000 mg | SUBCUTANEOUS | 0 refills | Status: DC

## 2020-12-19 MED ORDER — POLYETHYLENE GLYCOL 3350 17 G PO PACK: 17.0000 g | PACK | Freq: Every day | ORAL | 0 refills | Status: AC

## 2020-12-19 MED FILL — ENOXAPARIN SODIUM 120 MG/0.: 120 | 1 days supply | Qty: 1 | Fill #0

## 2020-12-19 MED FILL — WARFARIN SODIUM 5 MG TABLET: 5 | 20 days supply | Qty: 30 | Fill #0

## 2020-12-19 MED FILL — METHOCARBAMOL 500 MG TABS: 500 | 8 days supply | Qty: 30 | Fill #0

## 2020-12-19 MED FILL — oxyCODONE HCL 5 MG TABS: 5 | 2 days supply | Qty: 7 | Fill #0

## 2020-12-19 MED FILL — POLYETHYLENE GLYCOL 3350 PO: 17 | 28 days supply | Qty: 510 | Fill #0

## 2020-12-19 MED FILL — SUMATRIPTAN SUCC 100 MG TAB: 100 | 10 days supply | Qty: 10 | Fill #0

## 2020-12-19 NOTE — Discharge Summary (Addendum)
Physician Discharge Summary  ALYSABETH SCALIA ONG:[id number] DOB: 1996/09/21 DOA: 12/15/2020  PCP: Alm Bustard, MD  Admit date: 12/15/2020 Discharge date: 12/19/2020  Admitted From: Home Disposition:  Home  Recommendations for Outpatient Follow-up:  1. Follow up with general surgery in 1-2 weeks 2. Please obtain BMP/CBC in one week   Home Health:None Equipment/Devices:None  Discharge Condition:Stable CODE STATUS:Full Diet recommendation: Heart Healthy  Brief/Interim Summary: 25 y.o. female with a PMH ofantiphospholipid syndrome, DVT and PE on Coumadin, who presented to the ED with complaints of severe abdominal pain and fever. WBC 11.5 and CT consistent with acute cholecystitis. Surgery consulted.Underwent lap chole 2/21 afternoon  Discharge Diagnoses:  Principal Problem:   Acute cholecystitis due to biliary calculus Active Problems:   Anti-cardiolipin antibody positive   Hyponatremia   History of pulmonary embolism   History of DVT (deep vein thrombosis) Acute cholecystitis due to biliary calculus: Surgery was consulted she underwent lap chole on 12/16/2020 her heparin and Coumadin were held. Had an uneventful for procedure. Once she was able to take orals she was restarted on her Coumadin overlap with Lovenox, she will continue Lovenox for 1 additional day as an outpatient continue Coumadin.  Anticardiolipin antibody positive: She was overlap with heparin and Coumadin after proceeded and changed to Lovenox and her INR became 2.6. She will take 1 additional dose of Lovenox as an outpatient, and continue Coumadin at her regular dose. Goal INR is 2.5-3.5.  Treatment: Resolved with IV fluid hydration.  Microcytic anemia no signs of overt bleeding: Menstruating female follow-up with PCP as an outpatient.      Discharge Instructions  Discharge Instructions    Diet - low sodium heart healthy   Complete by: As directed    Increase activity slowly   Complete by:  As directed    No wound care   Complete by: As directed      Allergies as of 12/19/2020   No Known Allergies     Medication List    TAKE these medications   acetaminophen 500 MG tablet Commonly known as: TYLENOL Take 2 tablets (1,000 mg total) by mouth every 6 (six) hours as needed. What changed:   medication strength  how much to take  reasons to take this   enoxaparin 120 MG/0.8ML injection Commonly known as: LOVENOX Inject 0.74 mLs (110 mg total) into the skin daily.   methocarbamol 500 MG tablet Commonly known as: ROBAXIN Take 1 tablet (500 mg total) by mouth every 6 (six) hours as needed for muscle spasms.   Mirena (52 MG) 20 MCG/24HR IUD Generic drug: levonorgestrel 1 Intra Uterine Device by Intrauterine route daily.   oxyCODONE 5 MG immediate release tablet Commonly known as: Oxy IR/ROXICODONE Take 1-2 tablets (5-10 mg total) by mouth every 6 (six) hours as needed for moderate pain.   polyethylene glycol 17 g packet Commonly known as: MIRALAX / GLYCOLAX Take 17 g by mouth daily for 7 days.   SUMAtriptan 100 MG tablet Commonly known as: IMITREX Take 1 tablet (100 mg total) by mouth daily as needed for headache.   warfarin 5 MG tablet Commonly known as: COUMADIN Take 1 tablet (5 mg total) by mouth See admin instructions.  Monday,wednesday,friday. 7.5 mg Tuesday,thursday,saturday and sunday       Follow-up Information    [facility], [place]. Go on 12/31/2020.   Specialty: General Surgery Why: at 11:00 AM. please arrive 30 minutes early to get checked in and fill out any necesary paperwork. Contact information:  [address] [place] [address] [phone]             No Known Allergies  Consultations:  General surgery   Procedures/Studies: CT ABDOMEN PELVIS W CONTRAST  Result Date: 12/16/2020 CLINICAL DATA:  Abdominal pain, fever EXAM: CT ABDOMEN AND PELVIS WITH CONTRAST TECHNIQUE:  Multidetector CT imaging of the abdomen and pelvis was performed using the standard protocol following bolus administration of intravenous contrast. CONTRAST:  46mL OMNIPAQUE IOHEXOL 300 MG/ML  SOLN COMPARISON:  None. FINDINGS: Lower chest: Few bandlike opacities in the left lung base may reflect subsegmental atelectasis and/or scarring. Lung bases otherwise clear. Normal heart size. No pericardial effusion. Hepatobiliary: No worrisome focal liver lesions. Smooth liver surface contour. Normal hepatic attenuation. Moderately distended gallbladder with pericholecystic fluid and multiple noncalcified, partially pneumatized gallstones seen both at the gallbladder tip and towards the gallbladder neck. No significant biliary ductal dilatation is seen. No visible calcified intraductal gallstones. Pancreas: No pancreatic ductal dilatation or surrounding inflammatory changes. Spleen: Normal in size. No concerning splenic lesions. Adrenals/Urinary Tract: Normal adrenal glands. Kidneys are normally located with symmetric enhancement. No suspicious renal lesion, urolithiasis or hydronephrosis. Urinary bladder is unremarkable. Stomach/Bowel: Distal esophagus, stomach and duodenal sweep are unremarkable. No small bowel wall thickening or dilatation. No evidence of obstruction. A normal appendix is visualized. No colonic dilatation or wall thickening. Vascular/Lymphatic: No significant vascular findings are present. No enlarged abdominal or pelvic lymph nodes. Reproductive: Retroflexed uterus, expected positioning of an IUD. Normal follicles seen in the left ovary. Other: Trace low-attenuation free fluid in the deep pelvis, nonspecific and possibly reactive or physiologic in a reproductive age female. No free air. No bowel containing hernia. Musculoskeletal: No acute osseous abnormality or suspicious osseous lesion. Mild levocurvature of the lumbar levels, apex L4. IMPRESSION: 1. Moderately distended gallbladder with  pericholecystic fluid and multiple noncalcified, partially pneumatized gallstones seen both at the gallbladder tip and towards the gallbladder neck, consistent with acute cholecystitis. No significant biliary ductal dilatation. 2. Trace low-attenuation free fluid in the deep pelvis, nonspecific and possibly reactive or physiologic in a reproductive age female. 3. Retroflexed uterus, expected positioning of an IUD. Electronically Signed   By: Kreg Shropshire M.D.   On: 12/16/2020 00:22   (Echo, Carotid, EGD, Colonoscopy, ERCP)    Subjective: No complaints  Discharge Exam: Vitals:   12/18/20 2005 12/19/20 0503  BP: 108/72 114/71  Pulse: 65 61  Resp: 18 18  Temp: 98.5 F (36.9 C) 98.6 F (37 C)  SpO2: 100% 100%   Vitals:   12/18/20 0659 12/18/20 1500 12/18/20 2005 12/19/20 0503  BP: 107/65 96/62 108/72 114/71  Pulse: 61 60 65 61  Resp: 17  18 18   Temp: 98.8 F (37.1 C) 98.5 F (36.9 C) 98.5 F (36.9 C) 98.6 F (37 C)  TempSrc: Oral Oral Oral   SpO2: 100%  100% 100%  Weight:      Height:        General: Pt is alert, awake, not in acute distress Cardiovascular: RRR, S1/S2 +, no rubs, no gallops Respiratory: CTA bilaterally, no wheezing, no rhonchi Abdominal: Soft, NT, ND, bowel sounds + Extremities: no edema, no cyanosis    The results of significant diagnostics from this hospitalization (including imaging, microbiology, ancillary and laboratory) are listed below for reference.     Microbiology: Recent Results (from the past 240 hour(s))  Resp Panel by RT-PCR (Flu A&B, Covid) Nasopharyngeal Swab     Status: None   Collection  Time: 12/16/20  1:30 AM   Specimen: Nasopharyngeal Swab; Nasopharyngeal(NP) swabs in vial transport medium  Result Value Ref Range Status   SARS Coronavirus 2 by RT PCR NEGATIVE NEGATIVE Final    Comment: (NOTE) SARS-CoV-2 target nucleic acids are NOT DETECTED.  The SARS-CoV-2 RNA is generally detectable in upper respiratory specimens during the  acute phase of infection. The lowest concentration of SARS-CoV-2 viral copies this assay can detect is 138 copies/mL. A negative result does not preclude SARS-Cov-2 infection and should not be used as the sole basis for treatment or other patient management decisions. A negative result may occur with  improper specimen collection/handling, submission of specimen other than nasopharyngeal swab, presence of viral mutation(s) within the areas targeted by this assay, and inadequate number of viral copies(<138 copies/mL). A negative result must be combined with clinical observations, patient history, and epidemiological information. The expected result is Negative.  Fact Sheet for Patients:  BloggerCourse.com  Fact Sheet for Healthcare Providers:  SeriousBroker.it  This test is no t yet approved or cleared by the [place] FDA and  has been authorized for detection and/or diagnosis of SARS-CoV-2 by FDA under an Emergency Use Authorization (EUA). This EUA will remain  in effect (meaning this test can be used) for the duration of the COVID-19 declaration under Section 564(b)(1) of the Act, 21 U.S.C.section 360bbb-3(b)(1), unless the authorization is terminated  or revoked sooner.       Influenza A by PCR NEGATIVE NEGATIVE Final   Influenza B by PCR NEGATIVE NEGATIVE Final    Comment: (NOTE) The Xpert Xpress SARS-CoV-2/FLU/RSV plus assay is intended as an aid in the diagnosis of influenza from Nasopharyngeal swab specimens and should not be used as a sole basis for treatment. Nasal washings and aspirates are unacceptable for Xpert Xpress SARS-CoV-2/FLU/RSV testing.  Fact Sheet for Patients: BloggerCourse.com  Fact Sheet for Healthcare Providers: SeriousBroker.it  This test is not yet approved or cleared by the [place] FDA and has been authorized for detection and/or  diagnosis of SARS-CoV-2 by FDA under an Emergency Use Authorization (EUA). This EUA will remain in effect (meaning this test can be used) for the duration of the COVID-19 declaration under Section 564(b)(1) of the Act, 21 U.S.C. section 360bbb-3(b)(1), unless the authorization is terminated or revoked.  Performed at [facility], [address], [place], [place] [address]      Labs: BNP (last 3 results) No results for input(s): BNP in the last 8760 hours. Basic Metabolic Panel: Recent Labs  Lab 12/15/20 1849 12/17/20 0300 12/18/20 0206 12/19/20 0118  NA 133* 138 136 136  K 3.9 4.4 3.7 3.8  CL 103 104 102 102  CO2 22 25 25 24   GLUCOSE 98 118* 92 80  BUN 7 5* 5* <5*  CREATININE 0.64 0.81 0.80 0.84  CALCIUM 8.8* 8.7* 8.2* 8.3*   Liver Function Tests: Recent Labs  Lab 12/15/20 1849 12/17/20 0300 12/18/20 0206 12/19/20 0118  AST 15 35 22 19  ALT 13 27 23 20   ALKPHOS 51 55 42 46  BILITOT 0.5 0.4 0.4 0.2*  PROT 6.9 7.0 5.6* 5.8*  ALBUMIN 3.9 3.4* 3.0* 3.1*   Recent Labs  Lab 12/15/20 1849  LIPASE 34   No results for input(s): AMMONIA in the last 168 hours. CBC: Recent Labs  Lab 12/15/20 1849 12/17/20 0300 12/18/20 0206 12/19/20 0118  WBC 11.5* 10.3 9.1 6.4  HGB 13.8 13.3 11.4* 11.8*  HCT 43.2 42.6 36.1 37.6  MCV 79.9*  79.6* 79.2* 80.3  PLT 240 249 175 199   Cardiac Enzymes: No results for input(s): CKTOTAL, CKMB, CKMBINDEX, TROPONINI in the last 168 hours. BNP: Invalid input(s): POCBNP CBG: No results for input(s): GLUCAP in the last 168 hours. D-Dimer No results for input(s): DDIMER in the last 72 hours. Hgb A1c No results for input(s): HGBA1C in the last 72 hours. Lipid Profile No results for input(s): CHOL, HDL, LDLCALC, TRIG, CHOLHDL, LDLDIRECT in the last 72 hours. Thyroid function studies No results for input(s): TSH, T4TOTAL, T3FREE, THYROIDAB in the last 72 hours.  Invalid input(s): FREET3 Anemia work up No results for input(s):  VITAMINB12, FOLATE, FERRITIN, TIBC, IRON, RETICCTPCT in the last 72 hours. Urinalysis    Component Value Date/Time   COLORURINE STRAW (A) 12/15/2020 1907   APPEARANCEUR CLEAR 12/15/2020 1907   LABSPEC 1.008 12/15/2020 1907   PHURINE 6.0 12/15/2020 1907   GLUCOSEU NEGATIVE 12/15/2020 1907   HGBUR NEGATIVE 12/15/2020 1907   BILIRUBINUR NEGATIVE 12/15/2020 1907   KETONESUR NEGATIVE 12/15/2020 1907   PROTEINUR NEGATIVE 12/15/2020 1907   NITRITE NEGATIVE 12/15/2020 1907   LEUKOCYTESUR SMALL (A) 12/15/2020 1907   Sepsis Labs Invalid input(s): PROCALCITONIN,  WBC,  LACTICIDVEN Microbiology Recent Results (from the past 240 hour(s))  Resp Panel by RT-PCR (Flu A&B, Covid) Nasopharyngeal Swab     Status: None   Collection Time: 12/16/20  1:30 AM   Specimen: Nasopharyngeal Swab; Nasopharyngeal(NP) swabs in vial transport medium  Result Value Ref Range Status   SARS Coronavirus 2 by RT PCR NEGATIVE NEGATIVE Final    Comment: (NOTE) SARS-CoV-2 target nucleic acids are NOT DETECTED.  The SARS-CoV-2 RNA is generally detectable in upper respiratory specimens during the acute phase of infection. The lowest concentration of SARS-CoV-2 viral copies this assay can detect is 138 copies/mL. A negative result does not preclude SARS-Cov-2 infection and should not be used as the sole basis for treatment or other patient management decisions. A negative result may occur with  improper specimen collection/handling, submission of specimen other than nasopharyngeal swab, presence of viral mutation(s) within the areas targeted by this assay, and inadequate number of viral copies(<138 copies/mL). A negative result must be combined with clinical observations, patient history, and epidemiological information. The expected result is Negative.  Fact Sheet for Patients:  BloggerCourse.comhttps://www.fda.gov/media/152166/download  Fact Sheet for Healthcare Providers:  SeriousBroker.ithttps://www.fda.gov/media/152162/download  This test is  no t yet approved or cleared by the [place] FDA and  has been authorized for detection and/or diagnosis of SARS-CoV-2 by FDA under an Emergency Use Authorization (EUA). This EUA will remain  in effect (meaning this test can be used) for the duration of the COVID-19 declaration under Section 564(b)(1) of the Act, 21 U.S.C.section 360bbb-3(b)(1), unless the authorization is terminated  or revoked sooner.       Influenza A by PCR NEGATIVE NEGATIVE Final   Influenza B by PCR NEGATIVE NEGATIVE Final    Comment: (NOTE) The Xpert Xpress SARS-CoV-2/FLU/RSV plus assay is intended as an aid in the diagnosis of influenza from Nasopharyngeal swab specimens and should not be used as a sole basis for treatment. Nasal washings and aspirates are unacceptable for Xpert Xpress SARS-CoV-2/FLU/RSV testing.  Fact Sheet for Patients: BloggerCourse.comhttps://www.fda.gov/media/152166/download  Fact Sheet for Healthcare Providers: SeriousBroker.ithttps://www.fda.gov/media/152162/download  This test is not yet approved or cleared by the [place] FDA and has been authorized for detection and/or diagnosis of SARS-CoV-2 by FDA under an Emergency Use Authorization (EUA). This EUA will remain in effect (meaning this test can be  used) for the duration of the COVID-19 declaration under Section 564(b)(1) of the Act, 21 U.S.C. section 360bbb-3(b)(1), unless the authorization is terminated or revoked.  Performed at [facility], [address], [place], [place] [address]      Time coordinating discharge: Over 30 minutes  SIGNED:   Marinda Elk, MD  [facility] 12/19/2020, 11:02 AM Pager   If 7PM-7AM, please contact night-coverage [email or web address] Password [email or web address]

## 2020-12-24 DIAGNOSIS — Z419 Encounter for procedure for purposes other than remedying health state, unspecified: Secondary | ICD-10-CM | POA: Diagnosis not present

## 2021-01-24 DIAGNOSIS — Z419 Encounter for procedure for purposes other than remedying health state, unspecified: Secondary | ICD-10-CM | POA: Diagnosis not present

## 2021-02-23 DIAGNOSIS — Z419 Encounter for procedure for purposes other than remedying health state, unspecified: Secondary | ICD-10-CM | POA: Diagnosis not present

## 2021-03-26 DIAGNOSIS — Z419 Encounter for procedure for purposes other than remedying health state, unspecified: Secondary | ICD-10-CM | POA: Diagnosis not present

## 2021-04-20 ENCOUNTER — Other Ambulatory Visit: Payer: Self-pay

## 2021-04-20 ENCOUNTER — Encounter (HOSPITAL_COMMUNITY): Payer: Self-pay | Admitting: *Deleted

## 2021-04-20 ENCOUNTER — Emergency Department (HOSPITAL_COMMUNITY)
Admission: EM | Admit: 2021-04-20 | Discharge: 2021-04-20 | Disposition: A | Discharge status: Home / Self Care | Payer: Medicaid Other | Attending: Emergency Medicine | Admitting: Emergency Medicine

## 2021-04-20 DIAGNOSIS — Z7901 Long term (current) use of anticoagulants: Secondary | ICD-10-CM | POA: Insufficient documentation

## 2021-04-20 DIAGNOSIS — Z7189 Other specified counseling: Secondary | ICD-10-CM

## 2021-04-20 DIAGNOSIS — Z760 Encounter for issue of repeat prescription: Secondary | ICD-10-CM | POA: Insufficient documentation

## 2021-04-20 DIAGNOSIS — Z5181 Encounter for therapeutic drug level monitoring: Secondary | ICD-10-CM | POA: Insufficient documentation

## 2021-04-20 DIAGNOSIS — D6861 Antiphospholipid syndrome: Secondary | ICD-10-CM

## 2021-04-20 LAB — CBC
HCT: 45.5 % (ref 36.0–46.0)
Hemoglobin: 14 g/dL (ref 12.0–15.0)
MCH: 25.1 pg — ABNORMAL LOW (ref 26.0–34.0)
MCHC: 30.8 g/dL (ref 30.0–36.0)
MCV: 81.7 fL (ref 80.0–100.0)
Platelets: 288 10*3/uL (ref 150–400)
RBC: 5.57 MIL/uL — ABNORMAL HIGH (ref 3.87–5.11)
RDW: 14.2 % (ref 11.5–15.5)
WBC: 7.4 10*3/uL (ref 4.0–10.5)
nRBC: 0 % (ref 0.0–0.2)

## 2021-04-20 LAB — PROTIME-INR
INR: 1.1 (ref 0.8–1.2)
Prothrombin Time: 14.2 seconds (ref 11.4–15.2)

## 2021-04-20 LAB — I-STAT BETA HCG BLOOD, ED (MC, WL, AP ONLY): I-stat hCG, quantitative: <5 m[IU]/mL (ref <5)

## 2021-04-20 LAB — BASIC METABOLIC PANEL
Anion gap: 9 (ref 5–15)
BUN: 12 mg/dL (ref 6–20)
CO2: 23 mmol/L (ref 22–32)
Calcium: 8.9 mg/dL (ref 8.9–10.3)
Chloride: 107 mmol/L (ref 98–111)
Creatinine, Ser: 0.73 mg/dL (ref 0.44–1.00)
GFR, Estimated: >60 mL/min (ref >60)
Glucose, Bld: 94 mg/dL (ref 70–99)
Potassium: 3.7 mmol/L (ref 3.5–5.1)
Sodium: 139 mmol/L (ref 135–145)

## 2021-04-20 MED ORDER — ENOXAPARIN SODIUM 120 MG/0.8ML ~~LOC~~ SOLN: 115.0000 mg | SUBCUTANEOUS | 0 refills | Status: DC

## 2021-04-20 MED ORDER — ENOXAPARIN SODIUM 120 MG/0.8ML IJ SOSY
115.0000 mg | PREFILLED_SYRINGE | Freq: Once | INTRAMUSCULAR | Status: AC
  Administered 2021-04-20: 115 mg via SUBCUTANEOUS
  Filled 2021-04-20: qty 0.76

## 2021-04-20 NOTE — Discharge Instructions (Addendum)
Call the internal medicine clinic tomorrow to be seen in the next 48 hours.  Return for new or symptoms

## 2021-04-20 NOTE — ED Provider Notes (Signed)
[facility] EMERGENCY DEPARTMENT Provider Note   CSN: [id number] Arrival date & time: 04/20/21  1123     History Chief Complaint  Patient presents with   Medication Refill    Alicia Mendez is a 25 y.o. female with history significant for PE, antiphospholipid syndrome who presents for evaluation of needing medication refill.  Patient states she was supposed to be on Coumadin.  Was previously followed by [facility] health.  Subsequently was going to transition care to a week for prescription in [facility].  Unfortunately they do not have appointments until mid-September.  Her previous [facility] providers would not refill her medications.  She has subsequently been off her Coumadin over the last 2 weeks.  She denies any headache, chest pain, shortness of breath, lower extremity swelling, abdominal pain.  Denies additional aggravating or alleviating factors. She does not follow with a PCP.  History obtained from patient and past medical provider. No interpretor was used.    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     No family history on file.  Social History   Tobacco Use   Smoking status: Never   Smokeless tobacco: Never  Substance Use Topics   Alcohol use: Never   Drug use: Never    Home Medications Prior to Admission  medications   Medication Sig Start Date End Date Taking? Authorizing Provider  acetaminophen (TYLENOL) 500 MG tablet Take 2 tablets (1,000 mg total) by mouth every 6 (six) hours as needed. 12/17/20   Barnetta Chapel, PA-C  enoxaparin (LOVENOX) 120 MG/0.8ML injection Inject 0.76 mLs (115 mg total) into the skin daily for 5 days. 04/20/21 04/25/21  Benard Minturn A, PA-C  levonorgestrel (MIRENA, 52 MG,) 20 MCG/24HR IUD 1 Intra Uterine Device by Intrauterine route daily.    [provider]  methocarbamol (ROBAXIN) 500 MG tablet Take 1 tablet (500 mg total) by mouth every 6 (six) hours as needed for muscle spasms. 12/19/20   Marinda Elk, MD  methocarbamol (ROBAXIN) 500 MG tablet TAKE 1 TABLET (500 MG TOTAL) BY MOUTH EVERY 6 (SIX) HOURS AS NEEDED FOR MUSCLE SPASMS. 12/19/20 12/19/21  Marinda Elk, MD  oxyCODONE (OXY IR/ROXICODONE) 5 MG immediate release tablet Take 1-2 tablets (5-10 mg total) by mouth every 6 (six) hours as needed for moderate pain. 12/19/20   Marinda Elk, MD  oxyCODONE (OXY IR/ROXICODONE) 5 MG immediate release tablet TAKE 1-2 TABLETS (5-10 MG TOTAL) BY MOUTH EVERY 6 (SIX) HOURS AS NEEDED FOR MODERATE PAIN. 12/19/20 06/17/21  Marinda Elk, MD  polyethylene glycol powder (GLYCOLAX/MIRALAX) 17 GM/SCOOP powder TAKE 1 CAPFUL (17 G) BY MOUTH DAILY FOR 7 DAYS. 12/19/20 12/19/21  Marinda Elk, MD  SUMAtriptan (IMITREX) 100 MG tablet Take 1 tablet (100 mg total) by mouth daily as needed for headache. 12/19/20  Marinda Elk, MD  SUMAtriptan (IMITREX) 100 MG tablet TAKE 1 TABLET (100 MG TOTAL) BY MOUTH DAILY AS NEEDED FOR HEADACHE. 12/19/20 12/19/21  Marinda Elk, MD  warfarin (COUMADIN) 5 MG tablet Take 1 tablet (5 mg total) by mouth See admin instructions. 10mg  Monday,wednesday,friday. 7.5 mg Tuesday,thursday,saturday and sunday 12/19/20   12/21/20, MD  warfarin (COUMADIN) 5 MG tablet TAKE 2 TABLETS (10MG ) MONDAY, WEDNESDAY, FRIDAY &  1.5 TABLETS (7.5 MG) Marinda Elk AND SUNDAY 12/19/20 12/19/21  12/21/20, MD    Allergies    Patient has no known allergies.  Review of Systems   Review of Systems  Constitutional: Negative.   HENT: Negative.    Respiratory: Negative.    Cardiovascular: Negative.   Gastrointestinal: Negative.   Genitourinary: Negative.   Musculoskeletal: Negative.   Skin: Negative.   Neurological: Negative.   All other systems reviewed and are negative.  Physical Exam Updated Vital Signs BP 119/78   Pulse 87   Temp 98 F (36.7 C) (Oral)   Resp 15   SpO2 98%   Physical Exam Vitals and nursing note reviewed.  Constitutional:      General: She is not in acute distress.    Appearance: She is well-developed. She is not ill-appearing, toxic-appearing or diaphoretic.  HENT:     Head: Atraumatic.  Eyes:     Pupils: Pupils are equal, round, and reactive to light.  Cardiovascular:     Rate and Rhythm: Normal rate.  Pulmonary:     Effort: No respiratory distress.  Abdominal:     General: There is no distension.  Musculoskeletal:        General: Normal range of motion.     Cervical back: Normal range of motion.  Skin:    General: Skin is warm and dry.  Neurological:     General: No focal deficit present.     Mental Status: She is alert.  Psychiatric:        Mood and Affect: Mood normal.    ED Results / Procedures / Treatments   Labs (all labs ordered are listed, but only abnormal results are displayed) Labs Reviewed  CBC - Abnormal; Notable for the following components:      Result Value   RBC 5.57 (*)    MCH 25.1 (*)    All other components within normal limits  BASIC METABOLIC PANEL  PROTIME-INR  I-STAT BETA HCG BLOOD, ED ([facility], [facility], [facility] ONLY)    EKG None  Radiology No results found.  Procedures Procedures   Medications Ordered in ED Medications  enoxaparin (LOVENOX) injection 115 mg (115 mg Subcutaneous Given 04/20/21 1458)    ED Course  I  have reviewed the triage vital signs and the nursing notes.  Pertinent labs & imaging results that were available during my care of the patient were reviewed by me and considered in my medical decision making (see chart for details).  Patient here for evaluation medication refill.  Has antiphospholipid syndrome and has subsequently been off of Coumadin over the last 2 weeks.  She is currently between hematology providers.  Unfortunately her old one as well as her new provider were unwilling to refill her Coumadin.  She has no chest pain, shortness of breath, lower extremity swelling.    Discussed with pharmacist, Marinda Elk.  Unfortunately patient cannot do a DOAC due to antiphospholipid syndrome.  And she would need close outpatient follow-up for INR checks.  He will reach out to some  outpatient providers to see if patient can have close FU.  Preg negative CBC without leukocytosis  Metabolic panel without electrolyte, renal or liver abnormality INR 1.1  Discussed admission for Bridge to coumadin. Patient declined stating that she has children and cannot be admitted for anticoag bridge.  Discussed with Pharmacist who recommends Lovenox 115 and will need FU in the next few days  Will touch base with our teaching teams here to see if patient can get appointment in the next few days.  I was able to touch base with our internal medicine teaching colleagues. They will send a message to the front desk to get this patient scheduled and seen this week. They do have an INR clinic to monitor patient.  Appreciate their help with this patient.  I discussed this with the patient as well as pharmacy.  We will start her on Lovenox with anticipation that she will be transferred to Coumadin at outpatient follow-up this week.  Was given first dose of Lovenox here in the emergency department.  She will return for any worsening symptoms.  The patient has been appropriately medically screened and/or stabilized in the  ED. I have low suspicion for any other emergent medical condition which would require further screening, evaluation or treatment in the ED or require inpatient management.  Patient is hemodynamically stable and in no acute distress.  Patient able to ambulate in department prior to ED.  Evaluation does not show acute pathology that would require ongoing or additional emergent interventions while in the emergency department or further inpatient treatment.  I have discussed the diagnosis with the patient and answered all questions.  Pain is been managed while in the emergency department and patient has no further complaints prior to discharge.  Patient is comfortable with plan discussed in room and is stable for discharge at this time.  I have discussed strict return precautions for returning to the emergency department.  Patient was encouraged to follow-up with PCP/specialist refer to at discharge.      MDM Rules/Calculators/A&P                           Final Clinical Impression(s) / ED Diagnoses Final diagnoses:  Medication refill  Antiphospholipid syndrome (HCC)  Encounter for anticoagulation discussion and counseling    Rx / DC Orders ED Discharge Orders          Ordered    enoxaparin (LOVENOX) 120 MG/0.8ML injection  Every 24 hours,   Status:  Discontinued        04/20/21 1459    enoxaparin (LOVENOX) 120 MG/0.8ML injection  Every 24 hours        04/20/21 1500             Nury Nebergall A, PA-C 04/20/21 1516    Sabas Sous, MD 04/21/21 1649

## 2021-04-20 NOTE — ED Triage Notes (Signed)
Pt states that she needs a prescription for coumadin.  She states that she has a "blood clotting disorder" (does not recall the name) but knows that she needs to be on a blood thinner "for the rest of her life".  Pt reports that she had last dose 2 weeks ago and she switched to a new MD who would not prescribe the med until he sees her and the next available appointment with this MD is in September.  No SOB, no CP and no s/s of DVT.

## 2021-04-25 DIAGNOSIS — D500 Iron deficiency anemia secondary to blood loss (chronic): Secondary | ICD-10-CM | POA: Diagnosis not present

## 2021-04-25 DIAGNOSIS — Z419 Encounter for procedure for purposes other than remedying health state, unspecified: Secondary | ICD-10-CM | POA: Diagnosis not present

## 2021-04-25 DIAGNOSIS — D6861 Antiphospholipid syndrome: Secondary | ICD-10-CM | POA: Diagnosis not present

## 2021-04-29 ENCOUNTER — Encounter: Payer: Medicaid Other | Admitting: Student

## 2021-05-26 DIAGNOSIS — Z419 Encounter for procedure for purposes other than remedying health state, unspecified: Secondary | ICD-10-CM | POA: Diagnosis not present

## 2021-06-26 DIAGNOSIS — Z419 Encounter for procedure for purposes other than remedying health state, unspecified: Secondary | ICD-10-CM | POA: Diagnosis not present

## 2021-07-26 DIAGNOSIS — Z419 Encounter for procedure for purposes other than remedying health state, unspecified: Secondary | ICD-10-CM | POA: Diagnosis not present

## 2021-08-26 DIAGNOSIS — Z419 Encounter for procedure for purposes other than remedying health state, unspecified: Secondary | ICD-10-CM | POA: Diagnosis not present

## 2021-09-08 DIAGNOSIS — Z01419 Encounter for gynecological examination (general) (routine) without abnormal findings: Secondary | ICD-10-CM | POA: Diagnosis not present

## 2021-09-08 DIAGNOSIS — Z30432 Encounter for removal of intrauterine contraceptive device: Secondary | ICD-10-CM | POA: Diagnosis not present

## 2021-09-08 DIAGNOSIS — Z3009 Encounter for other general counseling and advice on contraception: Secondary | ICD-10-CM | POA: Diagnosis not present

## 2021-09-16 DIAGNOSIS — D6859 Other primary thrombophilia: Secondary | ICD-10-CM | POA: Diagnosis not present

## 2021-09-25 DIAGNOSIS — Z419 Encounter for procedure for purposes other than remedying health state, unspecified: Secondary | ICD-10-CM | POA: Diagnosis not present

## 2021-10-15 ENCOUNTER — Encounter (HOSPITAL_COMMUNITY): Payer: Self-pay | Admitting: Emergency Medicine

## 2021-10-15 ENCOUNTER — Emergency Department (HOSPITAL_COMMUNITY)
Admission: EM | Admit: 2021-10-15 | Discharge: 2021-10-16 | Disposition: A | Discharge status: Home / Self Care | Payer: Medicaid Other | Attending: Emergency Medicine | Admitting: Emergency Medicine

## 2021-10-15 ENCOUNTER — Other Ambulatory Visit: Payer: Self-pay

## 2021-10-15 DIAGNOSIS — R041 Hemorrhage from throat: Secondary | ICD-10-CM | POA: Insufficient documentation

## 2021-10-15 DIAGNOSIS — Z743 Need for continuous supervision: Secondary | ICD-10-CM | POA: Diagnosis not present

## 2021-10-15 DIAGNOSIS — K068 Other specified disorders of gingiva and edentulous alveolar ridge: Secondary | ICD-10-CM | POA: Diagnosis not present

## 2021-10-15 DIAGNOSIS — Z7901 Long term (current) use of anticoagulants: Secondary | ICD-10-CM | POA: Insufficient documentation

## 2021-10-15 DIAGNOSIS — R6889 Other general symptoms and signs: Secondary | ICD-10-CM | POA: Diagnosis not present

## 2021-10-15 DIAGNOSIS — R58 Hemorrhage, not elsewhere classified: Secondary | ICD-10-CM | POA: Diagnosis not present

## 2021-10-15 DIAGNOSIS — R0902 Hypoxemia: Secondary | ICD-10-CM | POA: Diagnosis not present

## 2021-10-15 DIAGNOSIS — R000 Tachycardia, unspecified: Secondary | ICD-10-CM | POA: Diagnosis not present

## 2021-10-15 LAB — CBC WITH DIFFERENTIAL/PLATELET
Abs Immature Granulocytes: 0.11 10*3/uL — ABNORMAL HIGH (ref 0.00–0.07)
Basophils Absolute: 0.1 10*3/uL (ref 0.0–0.1)
Basophils Relative: 0 %
Eosinophils Absolute: 0 10*3/uL (ref 0.0–0.5)
Eosinophils Relative: 0 %
HCT: 39.9 % (ref 36.0–46.0)
Hemoglobin: 12.8 g/dL (ref 12.0–15.0)
Immature Granulocytes: 1 %
Lymphocytes Relative: 18 %
Lymphs Abs: 4.3 10*3/uL — ABNORMAL HIGH (ref 0.7–4.0)
MCH: 25.8 pg — ABNORMAL LOW (ref 26.0–34.0)
MCHC: 32.1 g/dL (ref 30.0–36.0)
MCV: 80.4 fL (ref 80.0–100.0)
Monocytes Absolute: 1.2 10*3/uL — ABNORMAL HIGH (ref 0.1–1.0)
Monocytes Relative: 5 %
Neutro Abs: 18.7 10*3/uL — ABNORMAL HIGH (ref 1.7–7.7)
Neutrophils Relative %: 76 %
Platelets: 408 10*3/uL — ABNORMAL HIGH (ref 150–400)
RBC: 4.96 MIL/uL (ref 3.87–5.11)
RDW: 13.9 % (ref 11.5–15.5)
WBC: 24.4 10*3/uL — ABNORMAL HIGH (ref 4.0–10.5)
nRBC: 0 % (ref 0.0–0.2)

## 2021-10-15 LAB — TYPE AND SCREEN
ABO/RH(D): AB POS
Antibody Screen: NEGATIVE

## 2021-10-15 LAB — ABO/RH: ABO/RH(D): AB POS

## 2021-10-15 LAB — BASIC METABOLIC PANEL
Anion gap: 12 (ref 5–15)
BUN: 20 mg/dL (ref 6–20)
CO2: 18 mmol/L — ABNORMAL LOW (ref 22–32)
Calcium: 8.8 mg/dL — ABNORMAL LOW (ref 8.9–10.3)
Chloride: 106 mmol/L (ref 98–111)
Creatinine, Ser: 1.1 mg/dL — ABNORMAL HIGH (ref 0.44–1.00)
GFR, Estimated: >60 mL/min (ref >60)
Glucose, Bld: 179 mg/dL — ABNORMAL HIGH (ref 70–99)
Potassium: 3.9 mmol/L (ref 3.5–5.1)
Sodium: 136 mmol/L (ref 135–145)

## 2021-10-15 MED ORDER — SODIUM CHLORIDE 0.9 % IV BOLUS
1000.0000 mL | Freq: Once | INTRAVENOUS | Status: AC
  Administered 2021-10-15: 1000 mL via INTRAVENOUS

## 2021-10-15 MED ORDER — TRANEXAMIC ACID FOR EPISTAXIS
500.0000 mg | Freq: Once | TOPICAL | Status: AC
  Administered 2021-10-15: 23:00:00 500 mg via TOPICAL
  Filled 2021-10-15: qty 10

## 2021-10-15 MED ORDER — ONDANSETRON HCL 4 MG/2ML IJ SOLN
4.0000 mg | Freq: Once | INTRAMUSCULAR | Status: AC
  Administered 2021-10-15: 23:00:00 4 mg via INTRAVENOUS
  Filled 2021-10-15: qty 2

## 2021-10-15 NOTE — ED Provider Notes (Signed)
[facility] EMERGENCY DEPARTMENT Provider Note   CSN: [id number] Arrival date & time: 10/15/21  2140     History Chief Complaint  Patient presents with   Dental Problem    Alicia Mendez is a 25 y.o. female.  Patient presents to the ED with a chief complaint of oral bleeding.  She had her wisdom teeth removed yesterday.  She has hx of DVT and PE and is chronically anticoagulated with lovenox.  She held the lovenox the day before the surgery and restarted it today.  She has had significant bleeding and some hematemesis.  She has tried cool water rinses without any relief.  Surgery was performed by Dr. Fidela Salisbury, [facility].  The history is provided by the patient. No language interpreter was used.      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     History reviewed. No pertinent family history.  Social History   Tobacco Use   Smoking status: Never   Smokeless tobacco: Never  Substance Use Topics   Alcohol use: Never   Drug use: Never    Home Medications Prior to Admission medications   Medication Sig Start Date End Date Taking? Authorizing Provider  acetaminophen (TYLENOL) 500 MG tablet Take 2 tablets (1,000 mg total) by mouth every 6 (six) hours as needed. 12/17/20   Barnetta Chapel, PA-C  enoxaparin (LOVENOX) 120  MG/0.8ML injection Inject 0.76 mLs (115 mg total) into the skin daily for 5 days. 04/20/21 04/25/21  Henderly, Britni A, PA-C  levonorgestrel (MIRENA, 52 MG,) 20 MCG/24HR IUD 1 Intra Uterine Device by Intrauterine route daily.    [provider]  methocarbamol (ROBAXIN) 500 MG tablet Take 1 tablet (500 mg total) by mouth every 6 (six) hours as needed for muscle spasms. 12/19/20   Marinda Elk, MD  methocarbamol (ROBAXIN) 500 MG tablet TAKE 1 TABLET (500 MG TOTAL) BY MOUTH EVERY 6 (SIX) HOURS AS NEEDED FOR MUSCLE SPASMS. 12/19/20 12/19/21  Marinda Elk, MD  oxyCODONE (OXY IR/ROXICODONE) 5 MG immediate release tablet Take 1-2 tablets (5-10 mg total) by mouth every 6 (six) hours as needed for moderate pain. 12/19/20   Marinda Elk, MD  polyethylene glycol powder [facility]) 17 GM/SCOOP powder TAKE 1 CAPFUL (17 G) BY MOUTH DAILY FOR 7 DAYS. 12/19/20 12/19/21  Marinda Elk, MD  SUMAtriptan (IMITREX) 100 MG tablet Take 1 tablet (100 mg total) by mouth daily as needed for headache. 12/19/20   Marinda Elk, MD  SUMAtriptan (IMITREX) 100 MG tablet TAKE 1 TABLET (100 MG TOTAL) BY MOUTH DAILY AS NEEDED FOR HEADACHE. 12/19/20 12/19/21  Marinda Elk, MD  warfarin (COUMADIN) 5 MG tablet Take 1 tablet (5 mg total) by mouth See admin instructions. 10mg  Monday,wednesday,friday. 7.5 mg Tuesday,thursday,saturday and sunday 12/19/20   12/21/20, MD  warfarin (COUMADIN) 5  MG tablet TAKE 2 TABLETS (10MG ) MONDAY, WEDNESDAY, FRIDAY & 1.5 TABLETS (7.5 MG) , Jake Shark AND SUNDAY 12/19/20 12/19/21  12/21/21, MD    Allergies    Patient has no known allergies.  Review of Systems   Review of Systems  All other systems reviewed and are negative.  Physical Exam Updated Vital Signs BP 117/83 (BP Location: Left Arm)    Pulse (!) 120    Temp 98.2 F (36.8 C) (Oral)    Resp 16    Ht 5\' 3"  (1.6 m)    Wt 74.8 kg    SpO2 100%    BMI 29.21 kg/m    Physical Exam Vitals and nursing note reviewed.  Constitutional:      General: She is not in acute distress.    Appearance: She is well-developed.  HENT:     Head: Normocephalic and atraumatic.     Mouth/Throat:     Comments: No active bleeding from wisdom tooth sockets, but there is residual blood around lips Eyes:     Conjunctiva/sclera: Conjunctivae normal.  Cardiovascular:     Rate and Rhythm: Regular rhythm. Tachycardia present.     Heart sounds: No murmur heard. Pulmonary:     Effort: Pulmonary effort is normal. No respiratory distress.     Breath sounds: Normal breath sounds.  Abdominal:     Palpations: Abdomen is soft.     Tenderness: There is no abdominal tenderness.  Musculoskeletal:        General: No swelling.     Cervical back: Neck supple.  Skin:    General: Skin is warm and dry.     Capillary Refill: Capillary refill takes less than 2 seconds.  Neurological:     Mental Status: She is alert.  Psychiatric:        Mood and Affect: Mood normal.    ED Results / Procedures / Treatments   Labs (all labs ordered are listed, but only abnormal results are displayed) Labs Reviewed - No data to display  EKG None  Radiology No results found.  Procedures Procedures   Medications Ordered in ED Medications - No data to display  ED Course  I have reviewed the triage vital signs and the nursing notes.  Pertinent labs & imaging results that were available during my care of the patient were reviewed by me and considered in my medical decision making (see chart for details).    MDM Rules/Calculators/A&P                          Patient here with oral bleeding.  Had wisdom teeth removed yesterday.  Is anticoagulated on lovenox.  Has been having persistent bleeding.  Has an emesis back that is nearly full of blood.  10:12 PM Patient seen by and discussed with Dr. Marinda Elk.  May need to reverse lovenox.  Will wait on labs.  Not actively bleeding right now.  Will  apply ice to jaw.  Consider TXA soaked gauze if bleeding starts again.  Attempted to reach the patient's oral surgeon, but was unsuccessful.  Voicemail left.  Nobody on call for oral surgery now.  10:40 PM  HGB is 12.8.  Will continue to monitor.  Recheck HGB in 4 hrs.   Patient has been reassessed multiple times.  Her repeat hemoglobin is 10.5.  Her heart rate has stabilized along with her blood pressure.  She is able to ambulate around the emergency department.  She  states that she felt a little dizzy when first going from sitting to standing, but then felt fine.  She is agreeable with plan for discharge.  We discussed strict return precautions.  She will return if her symptoms worsen.  She is advised to follow-up with her oral surgeon in the morning.  Final Clinical Impression(s) / ED Diagnoses Final diagnoses:  Bleeding    Rx / DC Orders ED Discharge Orders     None        Roxy Horseman, Cordelia Poche 10/16/21 0220    Cheryll Cockayne, MD 10/23/21 2035

## 2021-10-15 NOTE — ED Notes (Signed)
Kathie Rhodes, [phone] would like an update when available

## 2021-10-15 NOTE — ED Triage Notes (Signed)
Pt BIB [facility], wisdom teeth removal yesterday with continuous bleeding since yesterday at 5:30pm. Pt reports hx clotting disorder. EMS reports initial BP 80s. A&Ox4

## 2021-10-16 LAB — CBC
HCT: 32.9 % — ABNORMAL LOW (ref 36.0–46.0)
Hemoglobin: 10.5 g/dL — ABNORMAL LOW (ref 12.0–15.0)
MCH: 26.4 pg (ref 26.0–34.0)
MCHC: 31.9 g/dL (ref 30.0–36.0)
MCV: 82.7 fL (ref 80.0–100.0)
Platelets: 207 10*3/uL (ref 150–400)
RBC: 3.98 MIL/uL (ref 3.87–5.11)
RDW: 13.8 % (ref 11.5–15.5)
WBC: 14 10*3/uL — ABNORMAL HIGH (ref 4.0–10.5)
nRBC: 0 % (ref 0.0–0.2)

## 2021-10-16 NOTE — Discharge Instructions (Signed)
If the bleeding returns, please return to the emergency department.  Please follow-up with your oral surgeon tomorrow.  You can expect to feel fatigued due to the blood loss.

## 2021-10-16 NOTE — ED Notes (Signed)
Discharge instructions reviewed with patient. Patient verbalized understanding of instructions. Follow-up care and medications were reviewed. Patient ambulatory with steady gait. VSS upon discharge.  ?

## 2021-10-26 DIAGNOSIS — Z419 Encounter for procedure for purposes other than remedying health state, unspecified: Secondary | ICD-10-CM | POA: Diagnosis not present

## 2021-10-26 NOTE — L&D Delivery Note (Signed)
OB/GYN Faculty Practice Delivery Note  Alicia Mendez is a 26 y.o. [phone] s/p SVD at [redacted]w[redacted]d. 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   ROM: 0h 21m with clear fluid GBS Status: Unknown, treated with ancef due to rapid progression of labor Maximum Maternal Temperature: 98.2  Labor Progress: Pt arrived to unit at 8cm and progressed to 9.5cm with right lateral cervical lip, then began involuntarily pushing.  Delivery Date/Time: 06/27/22 at 0929 Delivery: Called to room and patient was grunting with contractions and involuntarily pushing. Cervical exam showed pt complete so she was encouraged to begin pushing. Small amount of dark red blood noted with each push so TXA ordered/given. Nitrous oxide in use. Head delivered LOA with double nuchal and body cord present. Shoulder and body delivered via somersault removal and cord reduced. Infant with spontaneous cry, placed on mother's abdomen, dried and stimulated. Pitocin infusion started. Cord clamped x 2 after 1-minute delay, and cut by FOB. Cord blood drawn and cord sample collected. Placenta delivered spontaneously, intact, with 3-vessel cord. Fundus firm with massage, Pitocin and TXA. Labia, perineum, vagina, and cervix inspected, non-hemostatic first degree perineal tear noted and repaired with 3.0 vicryl rapide.  Placenta: Spontaneous, intact, sent to L&D for disposal Complications: Moderate bleeding prior to delivery but stopped at delivery Lacerations: First degree perineal EBL: 51 Analgesia: Nitrous oxide  Postpartum Planning [x]  transfer orders to MB [x]  discharge summary started & shared [x]  message to sent to schedule follow-up  [x]  lists updated [x]  vaccines UTD  Infant: Boy(unsure)  APGARs 8/9  2183g  , CNM, IBCLC Certified Nurse Midwife, [facility] for , [facility] 06/27/2022, 11:02 AM

## 2021-11-14 DIAGNOSIS — H5213 Myopia, bilateral: Secondary | ICD-10-CM | POA: Diagnosis not present

## 2021-11-23 ENCOUNTER — Inpatient Hospital Stay (HOSPITAL_COMMUNITY)
Admission: AD | Admit: 2021-11-23 | Discharge: 2021-11-23 | Disposition: A | Discharge status: Home / Self Care | Payer: Medicaid Other | Attending: Obstetrics & Gynecology | Admitting: Obstetrics & Gynecology

## 2021-11-23 ENCOUNTER — Inpatient Hospital Stay (HOSPITAL_COMMUNITY): Payer: Medicaid Other

## 2021-11-23 ENCOUNTER — Encounter (HOSPITAL_COMMUNITY): Payer: Self-pay | Admitting: Emergency Medicine

## 2021-11-23 ENCOUNTER — Other Ambulatory Visit: Payer: Self-pay

## 2021-11-23 DIAGNOSIS — R109 Unspecified abdominal pain: Secondary | ICD-10-CM | POA: Diagnosis not present

## 2021-11-23 DIAGNOSIS — Z7901 Long term (current) use of anticoagulants: Secondary | ICD-10-CM | POA: Diagnosis not present

## 2021-11-23 DIAGNOSIS — Z3490 Encounter for supervision of normal pregnancy, unspecified, unspecified trimester: Secondary | ICD-10-CM

## 2021-11-23 DIAGNOSIS — O99011 Anemia complicating pregnancy, first trimester: Secondary | ICD-10-CM | POA: Diagnosis not present

## 2021-11-23 DIAGNOSIS — O26851 Spotting complicating pregnancy, first trimester: Secondary | ICD-10-CM | POA: Insufficient documentation

## 2021-11-23 DIAGNOSIS — Z862 Personal history of diseases of the blood and blood-forming organs and certain disorders involving the immune mechanism: Secondary | ICD-10-CM | POA: Insufficient documentation

## 2021-11-23 DIAGNOSIS — O26891 Other specified pregnancy related conditions, first trimester: Secondary | ICD-10-CM | POA: Insufficient documentation

## 2021-11-23 DIAGNOSIS — O26899 Other specified pregnancy related conditions, unspecified trimester: Secondary | ICD-10-CM | POA: Diagnosis not present

## 2021-11-23 DIAGNOSIS — Z86718 Personal history of other venous thrombosis and embolism: Secondary | ICD-10-CM | POA: Insufficient documentation

## 2021-11-23 DIAGNOSIS — Z86711 Personal history of pulmonary embolism: Secondary | ICD-10-CM | POA: Diagnosis not present

## 2021-11-23 DIAGNOSIS — Z3A01 Less than 8 weeks gestation of pregnancy: Secondary | ICD-10-CM | POA: Insufficient documentation

## 2021-11-23 DIAGNOSIS — D649 Anemia, unspecified: Secondary | ICD-10-CM | POA: Diagnosis not present

## 2021-11-23 HISTORY — DX: Antiphospholipid syndrome: D68.61

## 2021-11-23 HISTORY — DX: Raised antibody titer: R76.0

## 2021-11-23 HISTORY — DX: Other pulmonary embolism without acute cor pulmonale: I26.99

## 2021-11-23 LAB — URINALYSIS, ROUTINE W REFLEX MICROSCOPIC
Bilirubin Urine: NEGATIVE
Glucose, UA: NEGATIVE mg/dL
Hgb urine dipstick: NEGATIVE
Ketones, ur: NEGATIVE mg/dL
Nitrite: NEGATIVE
Protein, ur: NEGATIVE mg/dL
Specific Gravity, Urine: 1.028 (ref 1.005–1.030)
pH: 5 (ref 5.0–8.0)

## 2021-11-23 LAB — CBC
HCT: 33.6 % — ABNORMAL LOW (ref 36.0–46.0)
Hemoglobin: 10.5 g/dL — ABNORMAL LOW (ref 12.0–15.0)
MCH: 24.4 pg — ABNORMAL LOW (ref 26.0–34.0)
MCHC: 31.3 g/dL (ref 30.0–36.0)
MCV: 78.1 fL — ABNORMAL LOW (ref 80.0–100.0)
Platelets: 203 10*3/uL (ref 150–400)
RBC: 4.3 MIL/uL (ref 3.87–5.11)
RDW: 15 % (ref 11.5–15.5)
WBC: 7.5 10*3/uL (ref 4.0–10.5)
nRBC: 0 % (ref 0.0–0.2)

## 2021-11-23 LAB — HCG, QUANTITATIVE, PREGNANCY: hCG, Beta Chain, Quant, S: 15381 m[IU]/mL — ABNORMAL HIGH (ref <5)

## 2021-11-23 LAB — I-STAT BETA HCG BLOOD, ED (MC, WL, AP ONLY): I-stat hCG, quantitative: >2000 m[IU]/mL — ABNORMAL HIGH (ref <5)

## 2021-11-23 LAB — WET PREP, GENITAL
Clue Cells Wet Prep HPF POC: NONE SEEN
Sperm: NONE SEEN
Trich, Wet Prep: NONE SEEN
WBC, Wet Prep HPF POC: >=10 — AB (ref <10)
Yeast Wet Prep HPF POC: NONE SEEN

## 2021-11-23 MED ORDER — ACETAMINOPHEN 500 MG PO TABS
1000.0000 mg | ORAL_TABLET | Freq: Four times a day (QID) | ORAL | Status: DC | PRN
  Administered 2021-11-23: 1000 mg via ORAL
  Filled 2021-11-23: qty 2

## 2021-11-23 NOTE — MAU Note (Signed)
Pt reports to mau with c/o lower left abd cramping for the past week that has worsened today.  Pt also reports spotting for the past week.

## 2021-11-23 NOTE — Discharge Instructions (Signed)
[facility] [employer] for [employer] at [employer] [facility]             [address] [place], [place] [address] [phone]  Center for [facility] at [facility]                                                             [address], [place], [place], [address] [phone]  Center for [facility] at [facility] [address], [address], [place], [place], [address] [phone]  Center for [facility] at [facility] [address], [address], [place], [place], [address] [phone]  Center for [facility] at [facility]                                 [address] [place], [place], [place], [address] [phone]  Center for [facility] at [facility]                                    [address] [place], [place], [address] [phone]  Center for [facility] at [facility] [address], [address], [place], [place], [address]                              [facility] [address], [place], [place], [address] [phone]  [facility]         Phone: [phone]  [facility]      Phone: [phone]   [facility]      Phone: [phone]  [facility]    Phone: [phone]  [facility]      Phone: [phone]  [facility]     Phone: [phone]  [facility]      Phone: [phone]

## 2021-11-23 NOTE — ED Provider Notes (Signed)
Emergency Medicine Provider OB Triage Evaluation Note  Alicia Mendez is a 26 y.o. female, G1P0, at Unknown gestation who presents to the emergency department with complaints of lower abd pain/cramping with spotting. Pt states she has had pain intermittently x1 week, much more severe today. Pain is worse on the L side. Just recently found out she is pregnant, sees Dr. Shawnie Pons. Has not f/u with his office this week. Has not had [place] to confirm IUP. Is on lovenox for blood disorder.   Review of  Systems  Positive: lower abd pain, spotting Negative: fevers, no bleeding since 4am  Physical Exam  BP 126/78 (BP Location: Right Arm)    Pulse (!) 114    Temp 98.4 F (36.9 C) (Oral)    Resp 16    LMP 11/24/2020    SpO2 100%  General: Awake, no distress  HEENT: Atraumatic  Resp: Normal effort  Cardiac: Normal rate Abd: Nondistended, ttp of lower abd, L >R  MSK: Moves all extremities without difficulty Neuro: Speech clear  Medical Decision Making  Pt evaluated for pregnancy concern and is stable for transfer to MAU. Pt is in agreement with plan for transfer.  12:23 PM Discussed with MAU APP, Jamaiya, who accepts patient in transfer.  Clinical Impression  No diagnosis found.     Alveria Apley, PA-C 11/23/21 1227    Terrilee Files, MD 11/24/21 [phone]

## 2021-11-23 NOTE — MAU Provider Note (Signed)
History     CSN: [id number]  Arrival date and time: 11/23/21 1207   Event Date/Time   First Provider Initiated Contact with Patient 11/23/21 1347      Chief Complaint  Patient presents with   pregnant- abd cramping   Vaginal Bleeding   Abdominal Pain   26 y.o. [id number] @[redacted]w[redacted]d  by LMP presenting with LAP. Reports onset of intermittent cramping 1 week ago. Pain worsened today. Rates pain 7/10. Has not treated it. Denies urinary sx. Denies vaginal discharge, itching, or malodor. Reports spotting last week and again this am, none since. Of note she has hx of clotting disorder, DVT, and PE and is taking Lovenox bid. No concern for STD, no new partner. Had IUD removed in November d/t HA, was not planning pregnancy.  OB History     Gravida  9   Para  4   Term  2   Preterm  2   AB  4   Living  4      SAB  4   IAB      Ectopic      Multiple      Live Births  4           Past Medical History:  Diagnosis Date   Anticardiolipin antibody positive    Antiphospholipid antibody syndrome (HCC)    Clotting disorder (HCC)    Gallstones    Pulmonary embolism (HCC)     Past Surgical History:  Procedure Laterality Date   CHOLECYSTECTOMY N/A 12/16/2020   Procedure: LAPAROSCOPIC CHOLECYSTECTOMY;  Surgeon: Kinsinger, De BlanchLuke Aaron, MD;  Location: [facility];  Service: General;  Laterality: N/A;    History reviewed. No pertinent family history.  Social History   Tobacco Use   Smoking status: Never   Smokeless tobacco: Never  Substance Use Topics   Alcohol use: Never   Drug use: Never    Allergies:  Allergies  Allergen Reactions   Penicillins Nausea And Vomiting and Other (See Comments)    Patient stated that she experienced severe sweating.    Medications Prior to Admission  Medication Sig Dispense Refill Last Dose   ferrous sulfate 325 (65 FE) MG tablet Take 325 mg by mouth daily with breakfast.      Prenatal Vit-Fe Fumarate-FA (MULTIVITAMIN-PRENATAL) 27-0.8 MG TABS  tablet Take 1 tablet by mouth daily at 12 noon.      acetaminophen (TYLENOL) 500 MG tablet Take 2 tablets (1,000 mg total) by mouth every 6 (six) hours as needed. (Patient taking differently: Take 1,000 mg by mouth every 6 (six) hours as needed for moderate pain or headache.) 30 tablet 0    amoxicillin (AMOXIL) 500 MG tablet Take 500 mg by mouth See admin instructions. Tid x 10 days (Patient not taking: Reported on 10/16/2021)      chlorhexidine (PERIDEX) 0.12 % solution 15 mLs by Mouth Rinse route See admin instructions. Bid until gone      enoxaparin (LOVENOX) 100 MG/ML injection Inject 100 mg into the skin in the morning and at bedtime.      enoxaparin (LOVENOX) 120 MG/0.8ML injection Inject 0.76 mLs (115 mg total) into the skin daily for 5 days. (Patient not taking: Reported on 10/16/2021) 3.8 mL 0    methocarbamol (ROBAXIN) 500 MG tablet Take 1 tablet (500 mg total) by mouth every 6 (six) hours as needed for muscle spasms. (Patient not taking: Reported on 10/16/2021) 30 tablet 0    methocarbamol (ROBAXIN) 500 MG tablet TAKE 1 TABLET (500 MG  TOTAL) BY MOUTH EVERY 6 (SIX) HOURS AS NEEDED FOR MUSCLE SPASMS. (Patient not taking: Reported on 10/16/2021) 30 tablet 0    oxyCODONE (OXY IR/ROXICODONE) 5 MG immediate release tablet Take 1-2 tablets (5-10 mg total) by mouth every 6 (six) hours as needed for moderate pain. (Patient not taking: Reported on 10/16/2021) 7 tablet 0    penicillin v potassium (VEETID) 500 MG tablet Take 500 mg by mouth See admin instructions. Qid x 14 days      polyethylene glycol powder (GLYCOLAX/MIRALAX) 17 GM/SCOOP powder TAKE 1 CAPFUL (17 G) BY MOUTH DAILY FOR 7 DAYS. (Patient not taking: Reported on 10/16/2021) 510 g 0    SUMAtriptan (IMITREX) 100 MG tablet Take 1 tablet (100 mg total) by mouth daily as needed for headache. (Patient not taking: Reported on 10/16/2021) 10 tablet 0    SUMAtriptan (IMITREX) 100 MG tablet TAKE 1 TABLET (100 MG TOTAL) BY MOUTH DAILY AS NEEDED FOR  HEADACHE. (Patient not taking: Reported on 10/16/2021) 10 tablet 0    warfarin (COUMADIN) 5 MG tablet Take 1 tablet (5 mg total) by mouth See admin instructions. 10mg  Monday,wednesday,friday. 7.5 mg Tuesday,thursday,saturday and sunday (Patient not taking: Reported on 10/16/2021) 30 tablet 0    warfarin (COUMADIN) 5 MG tablet TAKE 2 TABLETS (10MG ) MONDAY, WEDNESDAY, FRIDAY & 1.5 TABLETS (7.5 MG) TUESDAY, THURSDAY, SATURDAY AND SUNDAY (Patient not taking: Reported on 10/16/2021) 30 tablet 0     Review of Systems  Gastrointestinal:  Positive for abdominal pain.  Genitourinary:  Positive for vaginal bleeding. Negative for dysuria, frequency, urgency and vaginal discharge.  Physical Exam   Blood pressure 111/60, pulse 74, temperature 98 F (36.7 C), temperature source Oral, resp. rate 15, last menstrual period 10/04/2021, SpO2 100 %.  Physical Exam Vitals and nursing note reviewed. Exam conducted with a chaperone present.  Constitutional:      General: She is not in acute distress (appears comfortable).    Appearance: Normal appearance.  HENT:     Head: Normocephalic and atraumatic.  Cardiovascular:     Rate and Rhythm: Normal rate.  Pulmonary:     Effort: Pulmonary effort is normal.  Abdominal:     General: There is no distension.     Palpations: Abdomen is soft. There is no mass.     Tenderness: There is no abdominal tenderness. There is no guarding or rebound.     Hernia: No hernia is present.  Genitourinary:    Comments: External: no lesions or erythema Vagina: thin creamy discharge Uterus: + enlarged, anteverted, non tender, + CMT Adnexae: no masses, + tenderness left, no tenderness right Cervix closed   Musculoskeletal:        General: Normal range of motion.     Cervical back: Normal range of motion.  Skin:    General: Skin is warm and dry.  Neurological:     General: No focal deficit present.     Mental Status: She is alert and oriented to person, place, and time.   Psychiatric:        Mood and Affect: Mood normal.        Behavior: Behavior normal.   Results for orders placed or performed during the hospital encounter of 11/23/21 (from the past 24 hour(s))  I-Stat Beta hCG blood, ED ([facility], [facility], [facility] only)     Status: Abnormal   Collection Time: 11/23/21 12:17 PM  Result Value Ref Range   I-stat hCG, quantitative >2,000.0 (H) <5 mIU/mL   Comment 3  hCG, quantitative, pregnancy     Status: Abnormal   Collection Time: 11/23/21  1:19 PM  Result Value Ref Range   hCG, Beta Chain, Quant, S 15,381 (H) <5 mIU/mL  CBC     Status: Abnormal   Collection Time: 11/23/21  1:19 PM  Result Value Ref Range   WBC 7.5 4.0 - 10.5 K/uL   RBC 4.30 3.87 - 5.11 MIL/uL   Hemoglobin 10.5 (L) 12.0 - 15.0 g/dL   HCT 16.133.6 (L) 09.636.0 - 04.546.0 %   MCV 78.1 (L) 80.0 - 100.0 fL   MCH 24.4 (L) 26.0 - 34.0 pg   MCHC 31.3 30.0 - 36.0 g/dL   RDW 40.915.0 81.111.5 - 91.415.5 %   Platelets 203 150 - 400 K/uL   nRBC 0.0 0.0 - 0.2 %  Urinalysis, Routine w reflex microscopic Urine, Clean Catch     Status: Abnormal   Collection Time: 11/23/21  1:20 PM  Result Value Ref Range   Color, Urine YELLOW YELLOW   APPearance HAZY (A) CLEAR   Specific Gravity, Urine 1.028 1.005 - 1.030   pH 5.0 5.0 - 8.0   Glucose, UA NEGATIVE NEGATIVE mg/dL   Hgb urine dipstick NEGATIVE NEGATIVE   Bilirubin Urine NEGATIVE NEGATIVE   Ketones, ur NEGATIVE NEGATIVE mg/dL   Protein, ur NEGATIVE NEGATIVE mg/dL   Nitrite NEGATIVE NEGATIVE   Leukocytes,Ua TRACE (A) NEGATIVE   RBC / HPF 0-5 0 - 5 RBC/hpf   WBC, UA 0-5 0 - 5 WBC/hpf   Bacteria, UA RARE (A) NONE SEEN   Squamous Epithelial / LPF 6-10 0 - 5   Mucus PRESENT   Wet prep, genital     Status: Abnormal   Collection Time: 11/23/21  1:56 PM  Result Value Ref Range   Yeast Wet Prep HPF POC NONE SEEN NONE SEEN   Trich, Wet Prep NONE SEEN NONE SEEN   Clue Cells Wet Prep HPF POC NONE SEEN NONE SEEN   WBC, Wet Prep HPF POC >=10 (A) <10   Sperm NONE SEEN    US  OB LESS THAN 14 WEEKS WITH OB TRANSVAGINAL  Result Date: 11/23/2021 CLINICAL DATA:  Abdominal pain, beta hCG of 15,381 EXAM: OBSTETRIC <14 WK ULTRASOUND TECHNIQUE: Transabdominal ultrasound was performed for evaluation of the gestation as well as the maternal uterus and adnexal regions. COMPARISON:  None. FINDINGS: Intrauterine gestational sac: Single Yolk sac:  Visualized. Embryo:  Not Visualized. Cardiac Activity: Not Visualized. MSD:  8.7 mm   5 w   4 d Subchorionic hemorrhage:  None visualized. Maternal uterus/adnexae: Small volume of pelvic free fluid, within physiologic normal limits. IMPRESSION: Intrauterine pregnancy of uncertain viability, with a gestational sac and yolk sac visualized but no embryo or cardiac activity. Recommend close clinical follow-up with repeat ultrasound in 14 days. Electronically Signed   By: Maudry MayhewJeffrey  Waltz M.D.   On: 11/23/2021 15:08    MAU Course  Procedures Tylenol  MDM Labs and US ordered and reviewed. Early IUP seen on US, no FP. Discussed findings with pt. Stable for discharge home.   Assessment and Plan   1. History of pulmonary embolism   2. Abdominal pain affecting pregnancy   3. Early stage of pregnancy   4. Anemia affecting pregnancy in first trimester   5. History of blood clotting disorder    Discharge home Follow up with Dr. Shawnie Ponsorn to start care OB provider list given- states she may switch Follow up with [facility] next month as scheduled Continue Lovenox  SAB precautions  Allergies as of 11/23/2021       Reactions   Penicillins Nausea And Vomiting, Other (See Comments)   Patient stated that she experienced severe sweating.        Medication List     STOP taking these medications    amoxicillin 500 MG tablet Commonly known as: AMOXIL   chlorhexidine 0.12 % solution Commonly known as: PERIDEX   methocarbamol 500 MG tablet Commonly known as: ROBAXIN   oxyCODONE 5 MG immediate release tablet Commonly known as: Oxy  IR/ROXICODONE   penicillin v potassium 500 MG tablet Commonly known as: VEETID   polyethylene glycol powder 17 GM/SCOOP powder Commonly known as: GLYCOLAX/MIRALAX   SUMAtriptan 100 MG tablet Commonly known as: IMITREX   warfarin 5 MG tablet Commonly known as: COUMADIN       TAKE these medications    acetaminophen 500 MG tablet Commonly known as: TYLENOL Take 2 tablets (1,000 mg total) by mouth every 6 (six) hours as needed. What changed: reasons to take this   enoxaparin 100 MG/ML injection Commonly known as: LOVENOX Inject 100 mg into the skin in the morning and at bedtime. What changed: Another medication with the same name was removed. Continue taking this medication, and follow the directions you see here.   ferrous sulfate 325 (65 FE) MG tablet Take 325 mg by mouth daily with breakfast.   multivitamin-prenatal 27-0.8 MG Tabs tablet Take 1 tablet by mouth daily at 12 noon.       Donette Larry, CNM 11/23/2021, 4:15 PM

## 2021-11-23 NOTE — ED Triage Notes (Addendum)
Pt states she is 2-[redacted] weeks pregnant.  Reports lower abd cramping x 1 week that is worse on left side.

## 2021-11-24 ENCOUNTER — Other Ambulatory Visit: Payer: Self-pay | Admitting: *Deleted

## 2021-11-24 ENCOUNTER — Telehealth: Payer: Self-pay | Admitting: Obstetrics and Gynecology

## 2021-11-24 DIAGNOSIS — O26899 Other specified pregnancy related conditions, unspecified trimester: Secondary | ICD-10-CM

## 2021-11-24 DIAGNOSIS — R109 Unspecified abdominal pain: Secondary | ICD-10-CM

## 2021-11-24 LAB — GC/CHLAMYDIA PROBE AMP (~~LOC~~) NOT AT ARMC
Chlamydia: NEGATIVE
Comment: NEGATIVE
Comment: NORMAL
Neisseria Gonorrhea: NEGATIVE

## 2021-11-24 NOTE — Telephone Encounter (Signed)
Received a call from patient requesting an appointment to start prenatal care. She is about 5 weeks gst. When given the appointment, she requested to be seen sooner per the request of the provider she saw. She stated she needed to be seen in 14 days. Was going to schedule for a lab appointment, then noticed she was on Lovenox. I informed her she would be high risk. She said she was. I gave her the information to the [facility], and [facility] to get scheduled.

## 2021-11-24 NOTE — Progress Notes (Signed)
Request sent to office for follow up u/s order as noted need for repeat for viability.  Order entered today.

## 2021-11-26 DIAGNOSIS — Z419 Encounter for procedure for purposes other than remedying health state, unspecified: Secondary | ICD-10-CM | POA: Diagnosis not present

## 2021-11-27 DIAGNOSIS — N938 Other specified abnormal uterine and vaginal bleeding: Secondary | ICD-10-CM | POA: Diagnosis not present

## 2021-11-27 DIAGNOSIS — O200 Threatened abortion: Secondary | ICD-10-CM | POA: Diagnosis not present

## 2021-12-04 DIAGNOSIS — Z3491 Encounter for supervision of normal pregnancy, unspecified, first trimester: Secondary | ICD-10-CM | POA: Diagnosis not present

## 2021-12-04 DIAGNOSIS — O3680X1 Pregnancy with inconclusive fetal viability, fetus 1: Secondary | ICD-10-CM | POA: Diagnosis not present

## 2021-12-08 ENCOUNTER — Ambulatory Visit
Admission: RE | Admit: 2021-12-08 | Discharge: 2021-12-08 | Disposition: A | Discharge status: Home / Self Care | Payer: Medicaid Other | Source: Ambulatory Visit | Attending: Obstetrics and Gynecology | Admitting: Obstetrics and Gynecology

## 2021-12-08 ENCOUNTER — Other Ambulatory Visit: Payer: Self-pay

## 2021-12-08 DIAGNOSIS — R109 Unspecified abdominal pain: Secondary | ICD-10-CM | POA: Insufficient documentation

## 2021-12-08 DIAGNOSIS — Z3A01 Less than 8 weeks gestation of pregnancy: Secondary | ICD-10-CM | POA: Diagnosis not present

## 2021-12-08 DIAGNOSIS — O26899 Other specified pregnancy related conditions, unspecified trimester: Secondary | ICD-10-CM

## 2021-12-08 DIAGNOSIS — O3680X0 Pregnancy with inconclusive fetal viability, not applicable or unspecified: Secondary | ICD-10-CM | POA: Diagnosis not present

## 2021-12-09 DIAGNOSIS — Z3491 Encounter for supervision of normal pregnancy, unspecified, first trimester: Secondary | ICD-10-CM | POA: Diagnosis not present

## 2021-12-16 DIAGNOSIS — Z3201 Encounter for pregnancy test, result positive: Secondary | ICD-10-CM | POA: Diagnosis not present

## 2021-12-16 DIAGNOSIS — O3680X0 Pregnancy with inconclusive fetal viability, not applicable or unspecified: Secondary | ICD-10-CM | POA: Diagnosis not present

## 2021-12-16 DIAGNOSIS — N912 Amenorrhea, unspecified: Secondary | ICD-10-CM | POA: Diagnosis not present

## 2021-12-16 DIAGNOSIS — D6861 Antiphospholipid syndrome: Secondary | ICD-10-CM | POA: Diagnosis not present

## 2021-12-16 DIAGNOSIS — Z5941 Food insecurity: Secondary | ICD-10-CM | POA: Diagnosis not present

## 2021-12-16 DIAGNOSIS — Z3491 Encounter for supervision of normal pregnancy, unspecified, first trimester: Secondary | ICD-10-CM | POA: Diagnosis not present

## 2021-12-24 DIAGNOSIS — Z419 Encounter for procedure for purposes other than remedying health state, unspecified: Secondary | ICD-10-CM | POA: Diagnosis not present

## 2021-12-26 DIAGNOSIS — D500 Iron deficiency anemia secondary to blood loss (chronic): Secondary | ICD-10-CM | POA: Diagnosis not present

## 2021-12-26 DIAGNOSIS — I2699 Other pulmonary embolism without acute cor pulmonale: Secondary | ICD-10-CM | POA: Diagnosis not present

## 2021-12-26 DIAGNOSIS — D6861 Antiphospholipid syndrome: Secondary | ICD-10-CM | POA: Diagnosis not present

## 2022-01-02 ENCOUNTER — Encounter: Payer: Self-pay | Admitting: General Practice

## 2022-01-02 ENCOUNTER — Encounter: Payer: Self-pay | Admitting: Family Medicine

## 2022-01-02 ENCOUNTER — Other Ambulatory Visit: Payer: Self-pay

## 2022-01-02 ENCOUNTER — Ambulatory Visit (INDEPENDENT_AMBULATORY_CARE_PROVIDER_SITE_OTHER): Payer: Medicaid Other | Admitting: Family Medicine

## 2022-01-02 VITALS — BP 114/80 | HR 85 | Wt 158.0 lb

## 2022-01-02 DIAGNOSIS — R760 Raised antibody titer: Secondary | ICD-10-CM | POA: Diagnosis not present

## 2022-01-02 DIAGNOSIS — Z3A10 10 weeks gestation of pregnancy: Secondary | ICD-10-CM | POA: Diagnosis not present

## 2022-01-02 DIAGNOSIS — O09211 Supervision of pregnancy with history of pre-term labor, first trimester: Secondary | ICD-10-CM

## 2022-01-02 DIAGNOSIS — Z86718 Personal history of other venous thrombosis and embolism: Secondary | ICD-10-CM | POA: Diagnosis not present

## 2022-01-02 DIAGNOSIS — Z8759 Personal history of other complications of pregnancy, childbirth and the puerperium: Secondary | ICD-10-CM

## 2022-01-02 DIAGNOSIS — O0993 Supervision of high risk pregnancy, unspecified, third trimester: Secondary | ICD-10-CM | POA: Insufficient documentation

## 2022-01-02 DIAGNOSIS — O09899 Supervision of other high risk pregnancies, unspecified trimester: Secondary | ICD-10-CM | POA: Diagnosis not present

## 2022-01-02 DIAGNOSIS — Z86711 Personal history of pulmonary embolism: Secondary | ICD-10-CM | POA: Diagnosis not present

## 2022-01-02 DIAGNOSIS — O09219 Supervision of pregnancy with history of pre-term labor, unspecified trimester: Secondary | ICD-10-CM | POA: Insufficient documentation

## 2022-01-02 NOTE — Progress Notes (Signed)
?Subjective:  ?Alicia Mendez is a [id number] [redacted]w[redacted]d being seen today for her first obstetrical visit.  Her obstetrical history is significant for 4 prior vaginal deliveries and 4 prior first trimester losses. She has been diagnosed with antiphospholipid. During her last pregnancy, she developed a PE during the postpartum period while on prophylactic lovenox. At that point, she was told to be on treatment dose of lovenox during pregnancy and during postpartum period. She was told to be on lifelong anticoagulation. ? ?She also has a history of preterm labors. Her first pregnancy delivered at delivered at 36 weeks, then the second at 33 weeks. She then had two pregnancies delivered at 37 weeks while on makena.  ? ?She has a history of PPH. ? ?She was seen yesterday at Dr Cyndie Chime office and had an OB visit there, although they didn't do an exam and just got some bloodwork, including a NIPs test.  ? ?Patient does intend to breast feed. Pregnancy history fully reviewed. ? ?Patient reports nausea. ? ?BP 114/80   Pulse 85   Wt 158 lb (71.7 kg)   LMP 10/04/2021 (Exact Date)   BMI 27.99 kg/m?  ? ?HISTORY: ?OB History  ?Gravida Para Term Preterm AB Living  ?$Remove'9 4 2 2 4 4  'bVcViQC$ ?SAB IAB Ectopic Multiple Live Births  ?4       4  ?  ?# Outcome Date GA Lbr Len/2nd Weight Sex Delivery Anes PTL Lv  ?9 Current           ?8 Term 09/28/18 [redacted]w[redacted]d   F Vag-Vacuum   LIV  ?7 SAB 09/2017 [redacted]w[redacted]d         ?6 Term 01/18/17 [redacted]w[redacted]d  4 lb 11.7 oz (2.145 kg) M Vag-Spont   LIV  ?5 SAB 02/2015 [redacted]w[redacted]d         ?   Birth Comments: Blighted ovum  ?4 SAB 08/2014          ?3 SAB 2015          ?2 Preterm 10/30/10 [redacted]w[redacted]d  3 lb 11 oz (1.673 kg) F Vag-Spont   LIV  ?1 Preterm 12/19/09 [redacted]w[redacted]d  5 lb 11 oz (2.58 kg) F Vag-Spont   LIV  ? ? ?Past Medical History:  ?Diagnosis Date  ? Anticardiolipin antibody positive   ? Antiphospholipid antibody syndrome (HCC)   ? Clotting disorder ([place])   ? Gallstones   ? Pulmonary embolism ([place])   ? ? ?Past Surgical History:  ?Procedure  Laterality Date  ? CHOLECYSTECTOMY N/A 12/16/2020  ? Procedure: LAPAROSCOPIC CHOLECYSTECTOMY;  Surgeon: Kinsinger, Arta Bruce, MD;  Location: [facility];  Service: General;  Laterality: N/A;  ? ? ?Family History  ?Problem Relation Age of Onset  ? Anemia Mother   ? ? ? ?Exam  ?BP 114/80   Pulse 85   Wt 158 lb (71.7 kg)   LMP 10/04/2021 (Exact Date)   BMI 27.99 kg/m?  ? ?Chaperone present during exam ? ?CONSTITUTIONAL: Well-developed, well-nourished female in no acute distress.  ?HENT:  Normocephalic, atraumatic, External right and left ear normal. Oropharynx is clear and moist ?EYES: Conjunctivae and EOM are normal. Pupils are equal, round, and reactive to light. No scleral icterus.  ?NECK: Normal range of motion, supple, no masses.  Normal thyroid.  ?CARDIOVASCULAR: Normal heart rate noted, regular rhythm ?RESPIRATORY: Clear to auscultation bilaterally. Effort and breath sounds normal, no problems with respiration noted. ?BREASTS: Pt declined. ?ABDOMEN: Soft, normal bowel sounds, no distention noted.  No tenderness, rebound or guarding.  ?  PELVIC: Had PAP at [facility] in [place] ?MUSCULOSKELETAL: Normal range of motion. No tenderness.  No cyanosis, clubbing, or edema.  2+ distal pulses. ?SKIN: Skin is warm and dry. No rash noted. Not diaphoretic. No erythema. No pallor. ?NEUROLOGIC: Alert and oriented to person, place, and time. Normal reflexes, muscle tone coordination. No cranial nerve deficit noted. ?PSYCHIATRIC: Normal mood and affect. Normal behavior. Normal judgment and thought content. ? ?  ?Assessment:  ? ? Pregnancy: [id number] ?Patient Active Problem List  ? Diagnosis Date Noted  ? Supervision of other high risk pregnancy, antepartum, unspecified trimester 01/02/2022  ? Acute cholecystitis due to biliary calculus 12/16/2020  ? Hyponatremia 12/16/2020  ? History of pulmonary embolism 12/16/2020  ? History of DVT (deep vein thrombosis) 12/16/2020  ? Headache disorder 07/13/2019  ? Anti-cardiolipin antibody positive  08/26/2015  ? ? ?  ?Plan:  ? ?1. Supervision of other high risk pregnancy, antepartum, unspecified trimester ?- [place] MFM OB DETAIL +14 WK; Future ?- CHL AMB BABYSCRIPTS OPT IN ?- CBC/D/Plt+RPR+Rh+ABO+RubIgG... ?- Hemoglobin A1c ?- Comp Met (CMET) ?- Culture, OB Urine ? ?2. History of pulmonary embolism ?Currently on anticoagulation: lovenox 100mg  BID. Will continue on this.  ?I discussed with her that coumadin is safe to use while breastfeeding. However, some women prefer not to be on coumadin due to the frequency of INR checks during the postpartum period due to physiological changes that make INR fluctuate during that time. She is planning on breastfeeding, so DOACs are not an option until she stops.  ?I discussed that we would plan on 39 week induction if she makes it that far. ? ?3. History of DVT (deep vein thrombosis) ? ?4. Anti-cardiolipin antibody positive ? ?5. History of preterm labor, current pregnancy, unspecified trimester ?Makena is no longer recommended and currently there isn't a substitute to try to prolong pregnancy. Will do CL at MFM appt as screening.  ? ?6. History of prior pregnancy with SGA newborn ?Will likely need serial [place] to evaluate baby. ? ?Initial labs obtained ?Continue prenatal vitamins ?Reviewed n/v relief measures and warning s/s to report ?Reviewed recommended weight gain based on pre-gravid BMI ?Encouraged well-balanced diet ?Genetic & carrier screening discussed: requests NIPS, which she got at Dr Cyndie Chime office. Ultrasound discussed; fetal survey: requested ?CC[employer] completed> form faxed if has or is planning to apply for medicaid ?The nature of [place] for [facility] with multiple MDs and other Advanced Practice Providers was explained to patient; also emphasized that fellows, residents, and students are part of our team. ? ?Early screening for GDM done. ?ASA 81mg  at 12 weeks ? ? ?Problem list reviewed and updated. ?75% of 30 min visit spent on counseling and  coordination of care.  ?  ? ?Truett Mainland ?01/02/2022 ?

## 2022-01-03 LAB — CBC/D/PLT+RPR+RH+ABO+RUBIGG...
Antibody Screen: NEGATIVE
Basophils Absolute: 0 10*3/uL (ref 0.0–0.2)
Basos: 0 %
EOS (ABSOLUTE): 0 10*3/uL (ref 0.0–0.4)
Eos: 1 %
HCV Ab: NONREACTIVE
HIV Screen 4th Generation wRfx: NONREACTIVE
Hematocrit: 34.2 % (ref 34.0–46.6)
Hemoglobin: 10.8 g/dL — ABNORMAL LOW (ref 11.1–15.9)
Hepatitis B Surface Ag: NEGATIVE
Immature Grans (Abs): 0 10*3/uL (ref 0.0–0.1)
Immature Granulocytes: 0 %
Lymphocytes Absolute: 2.5 10*3/uL (ref 0.7–3.1)
Lymphs: 33 %
MCH: 23.6 pg — ABNORMAL LOW (ref 26.6–33.0)
MCHC: 31.6 g/dL (ref 31.5–35.7)
MCV: 75 fL — ABNORMAL LOW (ref 79–97)
Monocytes Absolute: 0.5 10*3/uL (ref 0.1–0.9)
Monocytes: 7 %
Neutrophils Absolute: 4.6 10*3/uL (ref 1.4–7.0)
Neutrophils: 59 %
Platelets: 267 10*3/uL (ref 150–450)
RBC: 4.58 x10E6/uL (ref 3.77–5.28)
RDW: 15.1 % (ref 11.7–15.4)
RPR Ser Ql: NONREACTIVE
Rh Factor: POSITIVE
Rubella Antibodies, IGG: 20.4 index (ref >0.99)
WBC: 7.7 10*3/uL (ref 3.4–10.8)

## 2022-01-03 LAB — COMPREHENSIVE METABOLIC PANEL
ALT: 12 IU/L (ref 0–32)
AST: 14 IU/L (ref 0–40)
Albumin/Globulin Ratio: 1.4 (ref 1.2–2.2)
Albumin: 4.1 g/dL (ref 3.9–5.0)
Alkaline Phosphatase: 66 IU/L (ref 44–121)
BUN/Creatinine Ratio: 16 (ref 9–23)
BUN: 10 mg/dL (ref 6–20)
Bilirubin Total: <0.2 mg/dL (ref 0.0–1.2)
CO2: 19 mmol/L — ABNORMAL LOW (ref 20–29)
Calcium: 9.3 mg/dL (ref 8.7–10.2)
Chloride: 101 mmol/L (ref 96–106)
Creatinine, Ser: 0.61 mg/dL (ref 0.57–1.00)
Globulin, Total: 2.9 g/dL (ref 1.5–4.5)
Glucose: 78 mg/dL (ref 70–99)
Potassium: 3.9 mmol/L (ref 3.5–5.2)
Sodium: 135 mmol/L (ref 134–144)
Total Protein: 7 g/dL (ref 6.0–8.5)
eGFR: 126 mL/min/{1.73_m2} (ref >59)

## 2022-01-03 LAB — HCV INTERPRETATION

## 2022-01-03 LAB — HEMOGLOBIN A1C
Est. average glucose Bld gHb Est-mCnc: 103 mg/dL
Hgb A1c MFr Bld: 5.2 % (ref 4.8–5.6)

## 2022-01-04 LAB — URINE CULTURE, OB REFLEX

## 2022-01-04 LAB — CULTURE, OB URINE

## 2022-01-14 ENCOUNTER — Telehealth: Payer: Self-pay

## 2022-01-14 NOTE — Telephone Encounter (Signed)
Patient states she was having mild cramping that started yesterday at 9am. She took tylenol and cramping came back at noon. Patient has tried heating pad as well.  ?Patient has had non stop cramping last night and  today. Patient only got two hours of sleep. Patient denies any bleeding.  ? ?Will consult with Dr. Adrian Blackwater and return call to patient. Armandina Stammer RN  ? ?Patient called and made aware we will see her tomorrow for OB check up. Notified if she has any bleeding before then she should seek evaluation at Maternity Admission unit at [facility] RN ?

## 2022-01-14 NOTE — Telephone Encounter (Signed)
Unable to reach patient. Called about some mild cramping she is having (12 w 3d). Armandina Stammer RN  ?

## 2022-01-15 ENCOUNTER — Ambulatory Visit (INDEPENDENT_AMBULATORY_CARE_PROVIDER_SITE_OTHER): Payer: Medicaid Other | Admitting: Family Medicine

## 2022-01-15 ENCOUNTER — Other Ambulatory Visit: Payer: Self-pay

## 2022-01-15 ENCOUNTER — Other Ambulatory Visit (HOSPITAL_COMMUNITY)
Admission: RE | Admit: 2022-01-15 | Discharge: 2022-01-15 | Disposition: A | Discharge status: Home / Self Care | Payer: Medicaid Other | Source: Ambulatory Visit | Attending: Family Medicine | Admitting: Family Medicine

## 2022-01-15 VITALS — BP 116/76 | HR 84 | Wt 157.0 lb

## 2022-01-15 DIAGNOSIS — Z3A12 12 weeks gestation of pregnancy: Secondary | ICD-10-CM

## 2022-01-15 DIAGNOSIS — Z8759 Personal history of other complications of pregnancy, childbirth and the puerperium: Secondary | ICD-10-CM

## 2022-01-15 DIAGNOSIS — Z86711 Personal history of pulmonary embolism: Secondary | ICD-10-CM

## 2022-01-15 DIAGNOSIS — R102 Pelvic and perineal pain: Secondary | ICD-10-CM

## 2022-01-15 DIAGNOSIS — O09219 Supervision of pregnancy with history of pre-term labor, unspecified trimester: Secondary | ICD-10-CM

## 2022-01-15 DIAGNOSIS — O09899 Supervision of other high risk pregnancies, unspecified trimester: Secondary | ICD-10-CM

## 2022-01-15 DIAGNOSIS — R760 Raised antibody titer: Secondary | ICD-10-CM

## 2022-01-15 DIAGNOSIS — Z86718 Personal history of other venous thrombosis and embolism: Secondary | ICD-10-CM

## 2022-01-15 LAB — POCT URINALYSIS DIPSTICK
Bilirubin, UA: NEGATIVE
Blood, UA: NEGATIVE
Glucose, UA: NEGATIVE
Ketones, UA: NEGATIVE
Nitrite, UA: NEGATIVE
Protein, UA: NEGATIVE
Spec Grav, UA: 1.02 (ref 1.010–1.025)
Urobilinogen, UA: 0.2 E.U./dL
pH, UA: 6 (ref 5.0–8.0)

## 2022-01-15 MED ORDER — OXYCODONE-ACETAMINOPHEN 5-325 MG PO TABS: 1.0000 | ORAL_TABLET | Freq: Four times a day (QID) | ORAL | 0 refills | Status: DC | PRN

## 2022-01-15 MED ORDER — METRONIDAZOLE 500 MG PO TABS: 500.0000 mg | ORAL_TABLET | Freq: Two times a day (BID) | ORAL | 0 refills | Status: DC

## 2022-01-15 NOTE — Progress Notes (Signed)
? ?  PRENATAL VISIT NOTE ? ?Subjective:  ?Alicia Mendez is a 26 y.o. [phone] at [redacted]w[redacted]d being seen today for ongoing prenatal care.  She is currently monitored for the following issues for this high-risk pregnancy and has Anti-cardiolipin antibody positive; Headache disorder; Acute cholecystitis due to biliary calculus; Hyponatremia; History of pulmonary embolism; History of DVT (deep vein thrombosis); Supervision of other high risk pregnancy, antepartum, unspecified trimester; History of preterm labor, current pregnancy, unspecified trimester; and History of prior pregnancy with SGA newborn on their problem list. ? ?Patient reports  severe cramping pain with vaginal discharge. No new partners. Only slept 5 hours over past couple of days due to the amount of pain she has been having . No bleeding.  Contractions: Not present. Vag. Bleeding: None.  Movement: Absent. Denies leaking of fluid.  ? ?The following portions of the patient's history were reviewed and updated as appropriate: allergies, current medications, past family history, past medical history, past social history, past surgical history and problem list.  ? ?Objective:  ? ?Vitals:  ? 01/15/22 1517  ?BP: 116/76  ?Pulse: 84  ?Weight: 157 lb (71.2 kg)  ? ? ?Fetal Status: Fetal Heart Rate (bpm): 155   Movement: Absent    ? ?General:  Alert, oriented and cooperative. Patient is in no acute distress.  ?Skin: Skin is warm and dry. No rash noted.   ?Cardiovascular: Normal heart rate noted  ?Respiratory: Normal respiratory effort, no problems with respiration noted  ?Abdomen: Soft, gravid, appropriate for gestational age.  Pain/Pressure: Present     ?Pelvic: Cervical exam performed in the presence of a chaperone      White creamy discharge. Patient unable to tolerate speculum or digital exam.  ?Extremities: Normal range of motion.  Edema: None  ?Mental Status: Normal mood and affect. Normal behavior. Normal judgment and thought content.  ? ?Assessment and Plan:   ?Pregnancy: [id number] at [redacted]w[redacted]d ?1. [redacted] weeks gestation of pregnancy ?2. Supervision of other high risk pregnancy, antepartum, unspecified trimester ?FHT and FH normal. ? ?3. History of preterm labor, current pregnancy, unspecified trimester ? ?4. History of prior pregnancy with SGA newborn ? ?5. History of pulmonary embolism ?On lovenox ? ?6. Anti-cardiolipin antibody positive ? ?7. History of DVT (deep vein thrombosis) ? ?8. Pelvic pain ?UA 2+ leuks. Will send UCx. ?Vaginal culture obtained. ?Will treat for BV and expand as needed. ?Pt not tolerating digital or speculum exam - declined further exam. If pain worsens, then pt to go to MAU for evaluation. ? ?Preterm labor symptoms and general obstetric precautions including but not limited to vaginal bleeding, contractions, leaking of fluid and fetal movement were reviewed in detail with the patient. ?Please refer to After Visit Summary for other counseling recommendations.  ? ?No follow-ups on file. ? ?Future Appointments  ?Date Time Provider [place]  ?02/11/2022 11:15 AM Truett Mainland, DO [facility] None  ?02/27/2022  1:30 PM [facility] NURSE [facility]  ?02/27/2022  1:45 PM [facility] US5 [facility]  ?03/06/2022 11:15 AM Truett Mainland, DO [facility] None  ? ? ?Truett Mainland, DO ?

## 2022-01-18 LAB — URINE CULTURE

## 2022-01-19 LAB — CERVICOVAGINAL ANCILLARY ONLY
Bacterial Vaginitis (gardnerella): POSITIVE — AB
Candida Glabrata: NEGATIVE
Candida Vaginitis: NEGATIVE
Chlamydia: NEGATIVE
Comment: NEGATIVE
Comment: NEGATIVE
Comment: NEGATIVE
Comment: NEGATIVE
Comment: NEGATIVE
Comment: NORMAL
Neisseria Gonorrhea: NEGATIVE
Trichomonas: NEGATIVE

## 2022-01-24 DIAGNOSIS — Z419 Encounter for procedure for purposes other than remedying health state, unspecified: Secondary | ICD-10-CM | POA: Diagnosis not present

## 2022-01-29 DIAGNOSIS — Z3491 Encounter for supervision of normal pregnancy, unspecified, first trimester: Secondary | ICD-10-CM | POA: Diagnosis not present

## 2022-01-29 DIAGNOSIS — Z1379 Encounter for other screening for genetic and chromosomal anomalies: Secondary | ICD-10-CM | POA: Diagnosis not present

## 2022-02-06 ENCOUNTER — Encounter: Payer: Medicaid Other | Admitting: Obstetrics & Gynecology

## 2022-02-11 ENCOUNTER — Ambulatory Visit (INDEPENDENT_AMBULATORY_CARE_PROVIDER_SITE_OTHER): Payer: Medicaid Other | Admitting: Family Medicine

## 2022-02-11 VITALS — BP 109/76 | HR 71 | Wt 155.0 lb

## 2022-02-11 DIAGNOSIS — R760 Raised antibody titer: Secondary | ICD-10-CM

## 2022-02-11 DIAGNOSIS — Z86711 Personal history of pulmonary embolism: Secondary | ICD-10-CM

## 2022-02-11 DIAGNOSIS — Z8759 Personal history of other complications of pregnancy, childbirth and the puerperium: Secondary | ICD-10-CM

## 2022-02-11 DIAGNOSIS — O09899 Supervision of other high risk pregnancies, unspecified trimester: Secondary | ICD-10-CM

## 2022-02-11 DIAGNOSIS — O09219 Supervision of pregnancy with history of pre-term labor, unspecified trimester: Secondary | ICD-10-CM

## 2022-02-11 NOTE — Progress Notes (Signed)
? ?  PRENATAL VISIT NOTE ? ?Subjective:  ?Alicia Mendez is a 26 y.o. [phone] at [redacted]w[redacted]d being seen today for ongoing prenatal care.  She is currently monitored for the following issues for this high-risk pregnancy and has Anti-cardiolipin antibody positive; Headache disorder; Acute cholecystitis due to biliary calculus; Hyponatremia; History of pulmonary embolism; History of DVT (deep vein thrombosis); Supervision of other high risk pregnancy, antepartum, unspecified trimester; History of preterm labor, current pregnancy, unspecified trimester; and History of prior pregnancy with SGA newborn on their problem list. ? ?Patient reports  anxiety about preterm labor. Didn't tolerate oral iron - had vomiting with it regardless of when she took it. Having some round ligament pain .  Contractions: Not present. Vag. Bleeding: None.  Movement: Absent. Denies leaking of fluid.  ? ?The following portions of the patient's history were reviewed and updated as appropriate: allergies, current medications, past family history, past medical history, past social history, past surgical history and problem list.  ? ?Objective:  ? ?Vitals:  ? 02/11/22 1133  ?BP: 109/76  ?Pulse: 71  ?Weight: 155 lb (70.3 kg)  ? ? ?Fetal Status: Fetal Heart Rate (bpm): 147   Movement: Absent    ? ?General:  Alert, oriented and cooperative. Patient is in no acute distress.  ?Skin: Skin is warm and dry. No rash noted.   ?Cardiovascular: Normal heart rate noted  ?Respiratory: Normal respiratory effort, no problems with respiration noted  ?Abdomen: Soft, gravid, appropriate for gestational age.  Pain/Pressure: Present     ?Pelvic: Cervical exam deferred        ?Extremities: Normal range of motion.  Edema: None  ?Mental Status: Normal mood and affect. Normal behavior. Normal judgment and thought content.  ? ?Assessment and Plan:  ?Pregnancy: [id number] at [redacted]w[redacted]d ?1. Supervision of other high risk pregnancy, antepartum, unspecified trimester ?FHT and Fh  normal ? ?2. History of preterm labor, current pregnancy, unspecified trimester ?Discussed that 17-P no longer recommended. Will have CL at [place] in 2 weeks. Will follow that. ? ?3. History of prior pregnancy with SGA newborn ? ?4. History of pulmonary embolism ?On treatment dose of lovenox. Needs life long anticoagulation ? ?5. Anti-cardiolipin antibody positive ? ?Preterm labor symptoms and general obstetric precautions including but not limited to vaginal bleeding, contractions, leaking of fluid and fetal movement were reviewed in detail with the patient. ?Please refer to After Visit Summary for other counseling recommendations.  ? ?No follow-ups on file. ? ?Future Appointments  ?Date Time Provider [place]  ?02/27/2022  1:30 PM [facility] NURSE [facility]  ?02/27/2022  1:45 PM [facility] US5 [facility]  ?03/06/2022 11:15 AM Truett Mainland, DO [facility] None  ?04/02/2022  1:50 PM Truett Mainland, DO [facility] None  ?04/30/2022  8:15 AM Nehemiah Settle Tanna Savoy, DO [facility] None  ?05/14/2022 11:15 AM Anyanwu, Sallyanne Havers, MD [facility] None  ? ? ?Truett Mainland, DO ?

## 2022-02-20 IMAGING — US US OB < 14 WEEKS - US OB TV
1 series · 15 of 18 positions shown · non-contrast
Comparison: None.

CLINICAL DATA: Abdominal pain, beta hCG of 15,381

EXAM:
OBSTETRIC <14 WK ULTRASOUND
TECHNIQUE: Transabdominal ultrasound was performed for evaluation of the
gestation as well as the maternal uterus and adnexal regions.

[Series 1: us ob < 14 weeks - us ob tv · 18 acquisitions, 15 frames shown]
[im 1/18]
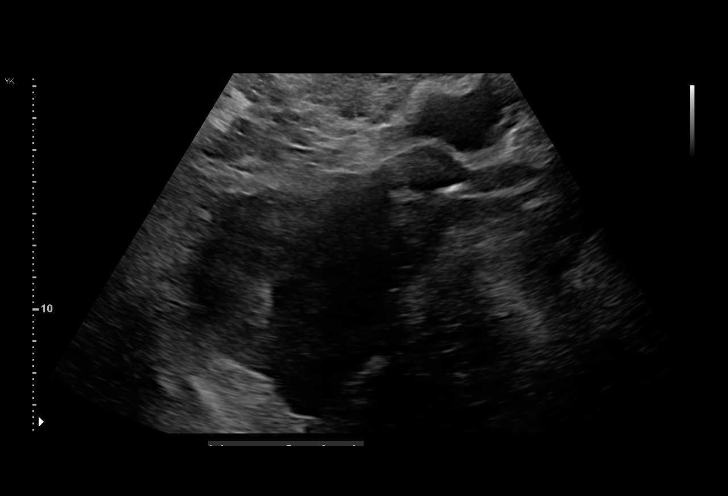
[im 2/18]
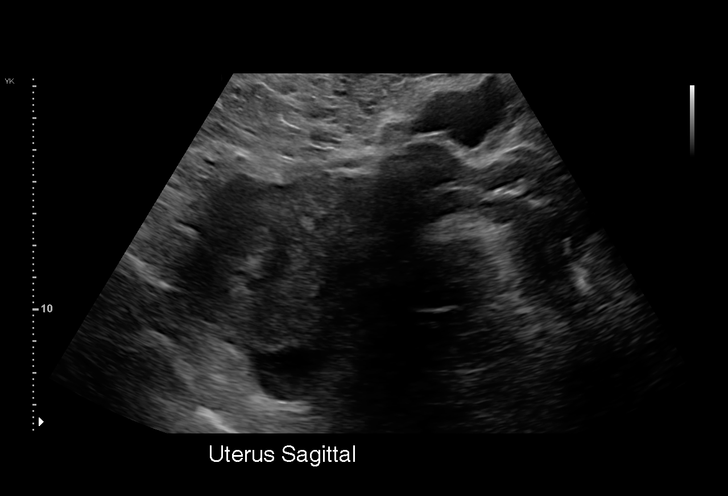
[im 4/18]
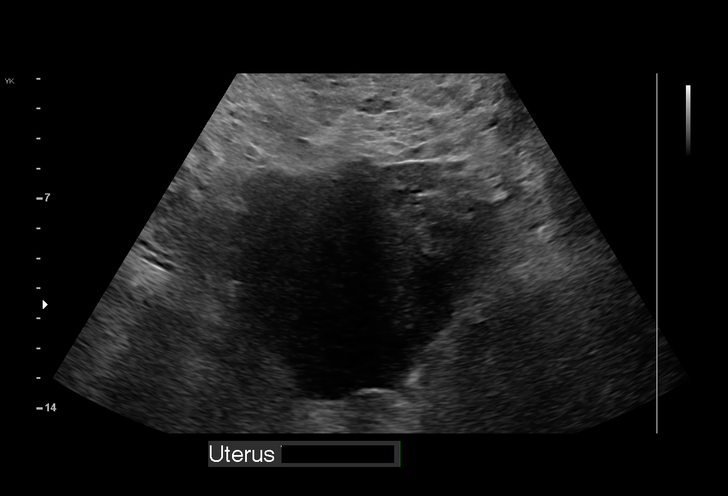
[im 5/18]
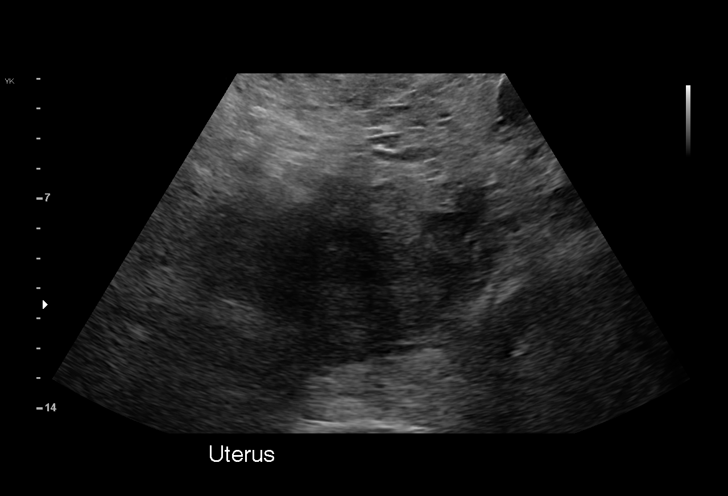
[im 6/18]
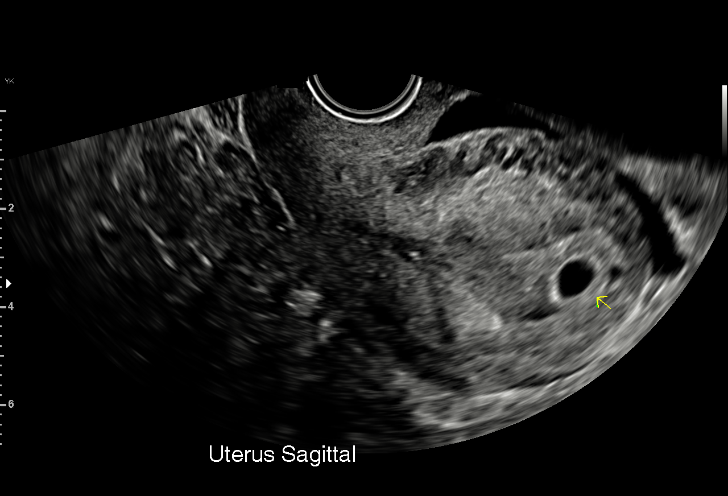
[im 7/18]
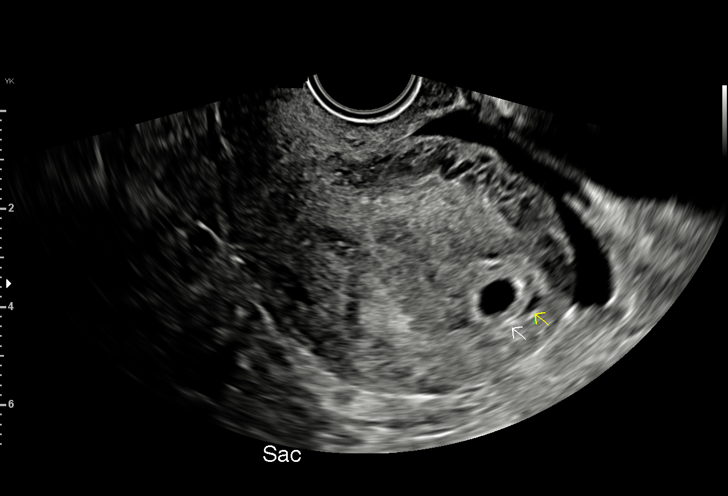
[im 8/18]
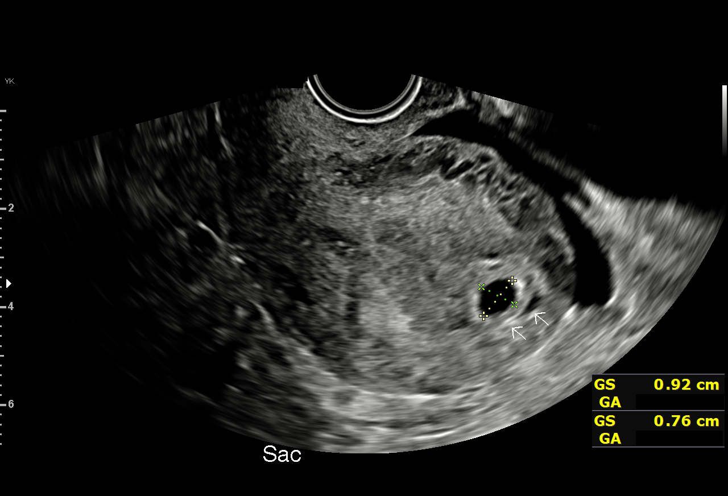
[im 10/18]
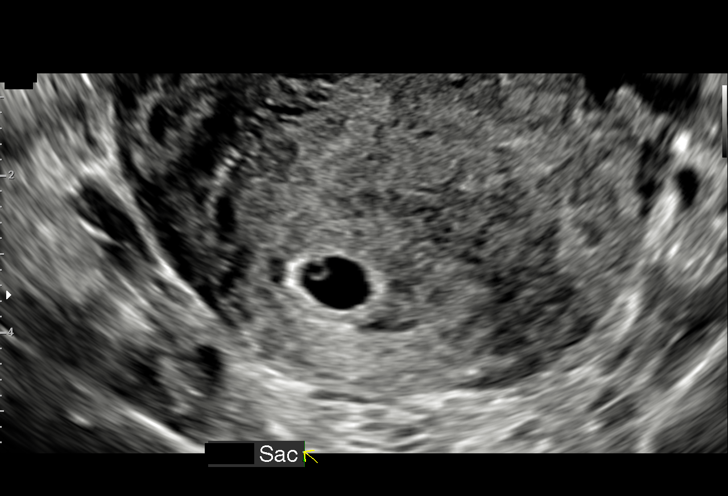
[im 11/18]
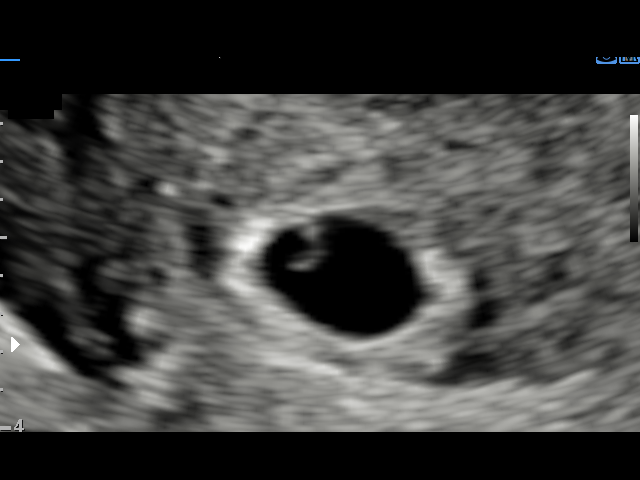
[im 12/18]
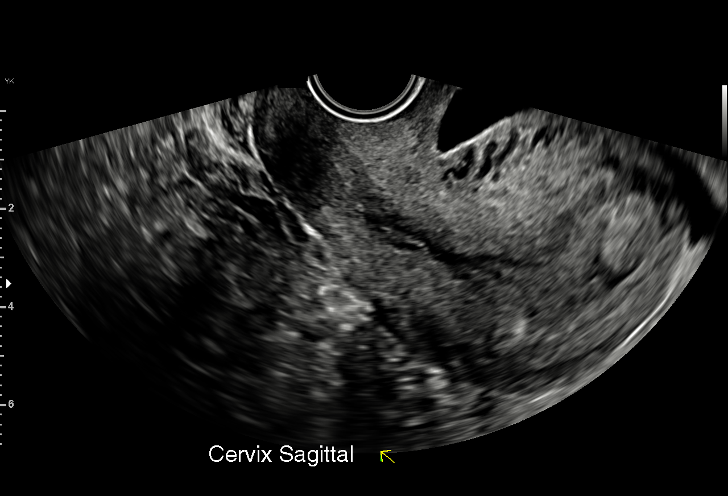
[im 13/18]
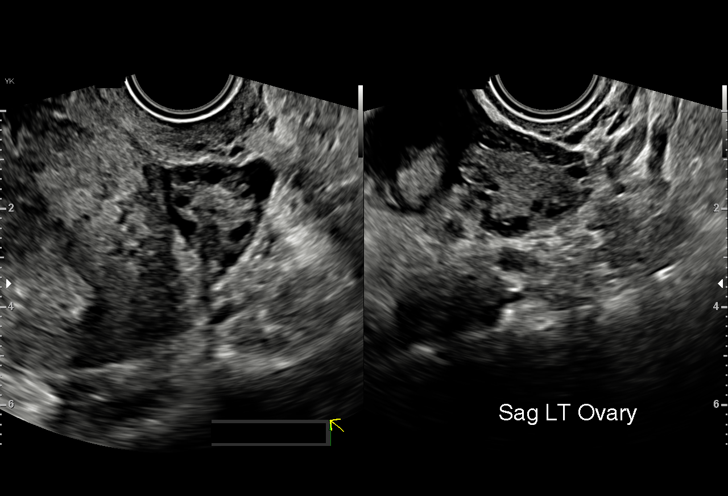
[im 14/18]
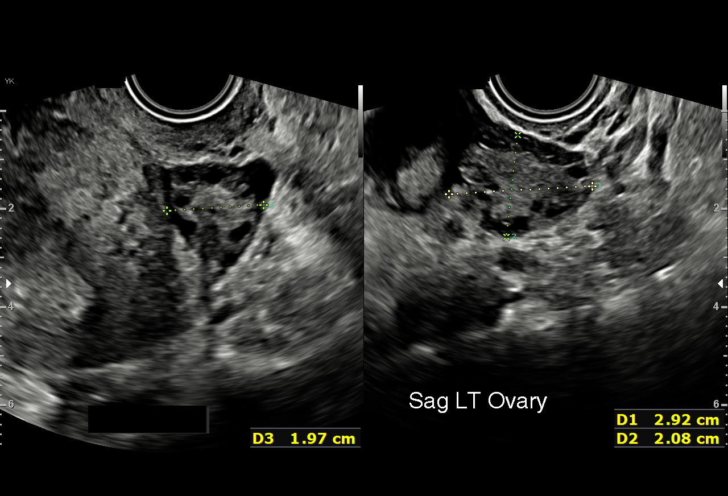
[im 16/18]
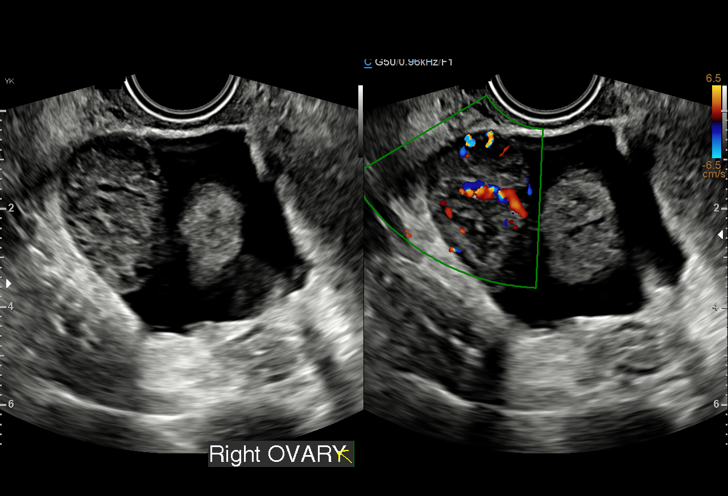
[im 17/18]
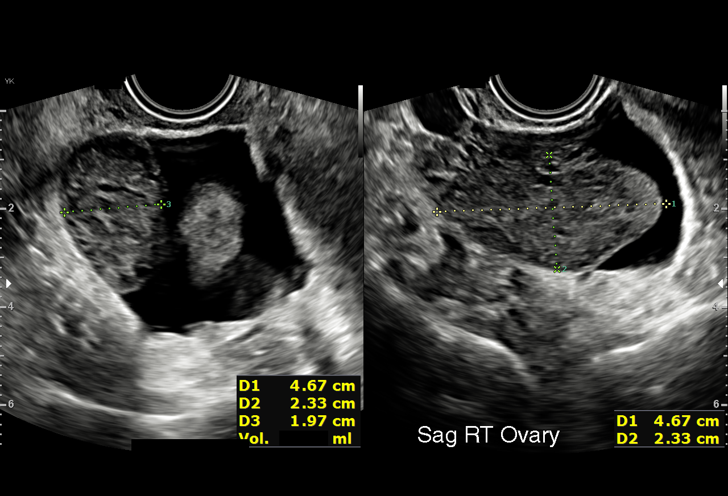
[im 18/18]
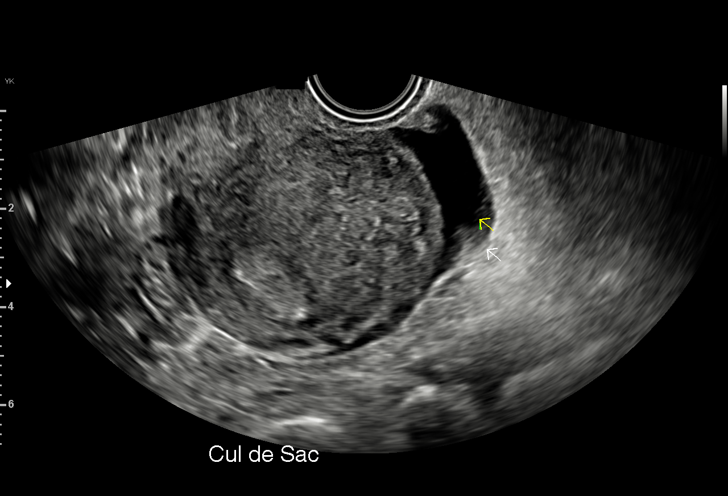

[15 of 18 positions shown; findings below may reference images not displayed]

FINDINGS: Intrauterine gestational sac: Single

Yolk sac:  Visualized.

Embryo:  Not Visualized.

Cardiac Activity: Not Visualized.

MSD:  8.7 mm   5 w   4 d

Subchorionic hemorrhage:  None visualized.

Maternal uterus/adnexae: Small volume of pelvic free fluid, within
physiologic normal limits.
IMPRESSION: Intrauterine pregnancy of uncertain viability, with a gestational
sac and yolk sac visualized but no embryo or cardiac activity.
Recommend close clinical follow-up with repeat ultrasound in 14
days.

## 2022-02-23 DIAGNOSIS — Z419 Encounter for procedure for purposes other than remedying health state, unspecified: Secondary | ICD-10-CM | POA: Diagnosis not present

## 2022-02-27 ENCOUNTER — Ambulatory Visit: Payer: Medicaid Other | Admitting: *Deleted

## 2022-02-27 ENCOUNTER — Ambulatory Visit: Payer: Medicaid Other | Attending: Family Medicine

## 2022-02-27 VITALS — BP 125/78 | HR 81

## 2022-02-27 DIAGNOSIS — O09899 Supervision of other high risk pregnancies, unspecified trimester: Secondary | ICD-10-CM

## 2022-03-02 ENCOUNTER — Other Ambulatory Visit: Payer: Self-pay | Admitting: *Deleted

## 2022-03-02 DIAGNOSIS — Z86718 Personal history of other venous thrombosis and embolism: Secondary | ICD-10-CM

## 2022-03-02 DIAGNOSIS — Z8759 Personal history of other complications of pregnancy, childbirth and the puerperium: Secondary | ICD-10-CM

## 2022-03-02 DIAGNOSIS — Z86711 Personal history of pulmonary embolism: Secondary | ICD-10-CM

## 2022-03-02 DIAGNOSIS — R760 Raised antibody titer: Secondary | ICD-10-CM

## 2022-03-02 DIAGNOSIS — O09899 Supervision of other high risk pregnancies, unspecified trimester: Secondary | ICD-10-CM

## 2022-03-06 ENCOUNTER — Other Ambulatory Visit: Payer: Self-pay | Admitting: Family Medicine

## 2022-03-06 ENCOUNTER — Ambulatory Visit (INDEPENDENT_AMBULATORY_CARE_PROVIDER_SITE_OTHER): Payer: Medicaid Other | Admitting: Family Medicine

## 2022-03-06 VITALS — BP 114/76 | HR 73 | Wt 157.0 lb

## 2022-03-06 DIAGNOSIS — Z8759 Personal history of other complications of pregnancy, childbirth and the puerperium: Secondary | ICD-10-CM

## 2022-03-06 DIAGNOSIS — O09899 Supervision of other high risk pregnancies, unspecified trimester: Secondary | ICD-10-CM

## 2022-03-06 DIAGNOSIS — Z86711 Personal history of pulmonary embolism: Secondary | ICD-10-CM

## 2022-03-06 DIAGNOSIS — Z3A19 19 weeks gestation of pregnancy: Secondary | ICD-10-CM

## 2022-03-06 DIAGNOSIS — R760 Raised antibody titer: Secondary | ICD-10-CM

## 2022-03-06 DIAGNOSIS — R42 Dizziness and giddiness: Secondary | ICD-10-CM

## 2022-03-06 NOTE — Progress Notes (Signed)
Patient states that when she wakes up some mornings she feels like she is going to pass out. Armandina Stammer RN  ?

## 2022-03-06 NOTE — Progress Notes (Signed)
? ?  PRENATAL VISIT NOTE ? ?Subjective:  ?Alicia Mendez is a 26 y.o. [phone] at 727w5d being seen today for ongoing prenatal care.  She is currently monitored for the following issues for this high-risk pregnancy and has Anti-cardiolipin antibody positive; Headache disorder; Acute cholecystitis due to biliary calculus; Hyponatremia; History of pulmonary embolism; History of DVT (deep vein thrombosis); Supervision of other high risk pregnancy, antepartum, unspecified trimester; History of preterm labor, current pregnancy, unspecified trimester; and History of prior pregnancy with SGA newborn on their problem list. ? ?Patient reports  episodes of black spots in her vision, feeling flushed and dizzy.  This happens in the morning a few minutes after getting out of bed.  This can happen before or after eating and drinking water.  Usually the episodes last for 3 to 5 minutes, then improved.  They resolve spontaneously. This has happened at work a couple of times and get better after sitting down.  No palliating or provoking factors.  Contractions: Not present. Vag. Bleeding: None.  Movement: Present. Denies leaking of fluid.  ? ?The following portions of the patient's history were reviewed and updated as appropriate: allergies, current medications, past family history, past medical history, past social history, past surgical history and problem list.  ? ?Objective:  ? ?Vitals:  ? 03/06/22 1118  ?BP: 114/76  ?Pulse: 73  ?Weight: 157 lb (71.2 kg)  ? ? ?Fetal Status: Fetal Heart Rate (bpm): 137   Movement: Present    ? ?General:  Alert, oriented and cooperative. Patient is in no acute distress.  ?Skin: Skin is warm and dry. No rash noted.   ?Cardiovascular: Normal heart rate noted  ?Respiratory: Normal respiratory effort, no problems with respiration noted  ?Abdomen: Soft, gravid, appropriate for gestational age.  Pain/Pressure: Absent     ?Pelvic: Cervical exam deferred        ?Extremities: Normal range of motion.   Edema: None  ?Mental Status: Normal mood and affect. Normal behavior. Normal judgment and thought content.  ? ?Assessment and Plan:  ?Pregnancy: [id number] at 3427w5d ?1. Supervision of other high risk pregnancy, antepartum, unspecified trimester ?- CBC ?- Comprehensive metabolic panel ?- TSH ? ?2. History of pulmonary embolism ?On Lovenox ?- CBC ?- Comprehensive metabolic panel ?- TSH ? ?3. Anti-cardiolipin antibody positive ?- CBC ?- Comprehensive metabolic panel ?- TSH ? ?4. History of prior pregnancy with SGA newborn ?Has serial ultrasounds for growth ? ?5. [redacted] weeks gestation of pregnancy ? ?6. Dizziness ?Unsure of what is causing the patient's symptoms.  We will check labs as below.  Recommended patient wearing compression stockings to see if this is effective.  Continue eating and drinking prior to getting out of bed. ?- CBC ?- Comprehensive metabolic panel ?- TSH ? ?Preterm labor symptoms and general obstetric precautions including but not limited to vaginal bleeding, contractions, leaking of fluid and fetal movement were reviewed in detail with the patient. ?Please refer to After Visit Summary for other counseling recommendations.  ? ?No follow-ups on file. ? ?Future Appointments  ?Date Time Provider Department Center  ?03/20/2022  3:15 PM [facility] NURSE [facility]  ?03/20/2022  3:30 PM [facility] US3 [facility]  ?04/02/2022  1:50 PM Levie HeritageStinson, Blondine Hottel J, DO [facility] None  ?04/03/2022  3:15 PM [facility]  ?04/03/2022  3:30 PM [facility] US3 [facility]  ?04/30/2022  8:15 AM Adrian BlackwaterStinson, Rhona RaiderJacob J, DO [facility] None  ?05/14/2022 11:15 AM Anyanwu, Jethro BastosUgonna A, MD [facility] None  ? ? ?Levie HeritageJacob J Bernardette Waldron, DO ?

## 2022-03-07 LAB — COMPREHENSIVE METABOLIC PANEL
ALT: 10 IU/L (ref 0–32)
AST: 10 IU/L (ref 0–40)
Albumin/Globulin Ratio: 1.4 (ref 1.2–2.2)
Albumin: 3.9 g/dL (ref 3.9–5.0)
Alkaline Phosphatase: 63 IU/L (ref 44–121)
BUN/Creatinine Ratio: 15 (ref 9–23)
BUN: 9 mg/dL (ref 6–20)
Bilirubin Total: <0.2 mg/dL (ref 0.0–1.2)
CO2: 21 mmol/L (ref 20–29)
Calcium: 8.8 mg/dL (ref 8.7–10.2)
Chloride: 101 mmol/L (ref 96–106)
Creatinine, Ser: 0.6 mg/dL (ref 0.57–1.00)
Globulin, Total: 2.7 g/dL (ref 1.5–4.5)
Glucose: 69 mg/dL — ABNORMAL LOW (ref 70–99)
Potassium: 4.1 mmol/L (ref 3.5–5.2)
Sodium: 135 mmol/L (ref 134–144)
Total Protein: 6.6 g/dL (ref 6.0–8.5)
eGFR: 126 mL/min/{1.73_m2} (ref >59)

## 2022-03-07 LAB — CBC
Hematocrit: 32.5 % — ABNORMAL LOW (ref 34.0–46.6)
Hemoglobin: 10.4 g/dL — ABNORMAL LOW (ref 11.1–15.9)
MCH: 23.9 pg — ABNORMAL LOW (ref 26.6–33.0)
MCHC: 32 g/dL (ref 31.5–35.7)
MCV: 75 fL — ABNORMAL LOW (ref 79–97)
Platelets: 220 10*3/uL (ref 150–450)
RBC: 4.35 x10E6/uL (ref 3.77–5.28)
RDW: 17.5 % — ABNORMAL HIGH (ref 11.7–15.4)
WBC: 9.5 10*3/uL (ref 3.4–10.8)

## 2022-03-09 ENCOUNTER — Telehealth: Payer: Self-pay

## 2022-03-09 NOTE — Addendum Note (Signed)
Addended by: Levie Heritage on: 03/09/2022 10:30 AM ? ? Modules accepted: Orders ? ?

## 2022-03-09 NOTE — Telephone Encounter (Signed)
-----   Message from Levie Heritage, DO sent at 03/09/2022 10:30 AM EDT ----- ?Needs to be scheduled for venofer infusions 300mg  weekly x 3 weeks. Orders placed ?

## 2022-03-09 NOTE — Telephone Encounter (Signed)
Called pt to inform her that her iron is low and the need for an iron infusion. Iron infusion was scheduled.Understanding was voiced. ?Cavion Faiola l Lesbia Ottaway, CMA  ?

## 2022-03-16 ENCOUNTER — Other Ambulatory Visit (HOSPITAL_COMMUNITY): Payer: Self-pay | Admitting: *Deleted

## 2022-03-17 ENCOUNTER — Encounter (HOSPITAL_COMMUNITY)
Admission: RE | Admit: 2022-03-17 | Discharge: 2022-03-17 | Disposition: A | Discharge status: Home / Self Care | Payer: Medicaid Other | Source: Ambulatory Visit | Attending: Family Medicine | Admitting: Family Medicine

## 2022-03-17 DIAGNOSIS — D509 Iron deficiency anemia, unspecified: Secondary | ICD-10-CM | POA: Diagnosis not present

## 2022-03-17 DIAGNOSIS — O99019 Anemia complicating pregnancy, unspecified trimester: Secondary | ICD-10-CM | POA: Diagnosis not present

## 2022-03-17 MED ORDER — SODIUM CHLORIDE 0.9 % IV SOLN
300.0000 mg | INTRAVENOUS | Status: DC
  Administered 2022-03-17: 300 mg via INTRAVENOUS
  Filled 2022-03-17: qty 15

## 2022-03-20 ENCOUNTER — Ambulatory Visit: Payer: Medicaid Other | Admitting: *Deleted

## 2022-03-20 ENCOUNTER — Encounter: Payer: Self-pay | Admitting: *Deleted

## 2022-03-20 ENCOUNTER — Ambulatory Visit: Payer: Medicaid Other | Attending: Obstetrics and Gynecology

## 2022-03-20 VITALS — BP 107/64 | HR 80

## 2022-03-20 DIAGNOSIS — Z86718 Personal history of other venous thrombosis and embolism: Secondary | ICD-10-CM | POA: Insufficient documentation

## 2022-03-20 DIAGNOSIS — O99112 Other diseases of the blood and blood-forming organs and certain disorders involving the immune mechanism complicating pregnancy, second trimester: Secondary | ICD-10-CM

## 2022-03-20 DIAGNOSIS — E669 Obesity, unspecified: Secondary | ICD-10-CM | POA: Diagnosis not present

## 2022-03-20 DIAGNOSIS — O288 Other abnormal findings on antenatal screening of mother: Secondary | ICD-10-CM

## 2022-03-20 DIAGNOSIS — D6861 Antiphospholipid syndrome: Secondary | ICD-10-CM

## 2022-03-20 DIAGNOSIS — Z3A21 21 weeks gestation of pregnancy: Secondary | ICD-10-CM | POA: Diagnosis not present

## 2022-03-20 DIAGNOSIS — Z86711 Personal history of pulmonary embolism: Secondary | ICD-10-CM | POA: Insufficient documentation

## 2022-03-20 DIAGNOSIS — O99212 Obesity complicating pregnancy, second trimester: Secondary | ICD-10-CM

## 2022-03-20 DIAGNOSIS — O99891 Other specified diseases and conditions complicating pregnancy: Secondary | ICD-10-CM | POA: Diagnosis not present

## 2022-03-20 DIAGNOSIS — O09899 Supervision of other high risk pregnancies, unspecified trimester: Secondary | ICD-10-CM | POA: Insufficient documentation

## 2022-03-20 DIAGNOSIS — Z8759 Personal history of other complications of pregnancy, childbirth and the puerperium: Secondary | ICD-10-CM | POA: Insufficient documentation

## 2022-03-20 DIAGNOSIS — R760 Raised antibody titer: Secondary | ICD-10-CM | POA: Insufficient documentation

## 2022-03-25 ENCOUNTER — Encounter (HOSPITAL_COMMUNITY)
Admission: RE | Admit: 2022-03-25 | Discharge: 2022-03-25 | Disposition: A | Discharge status: Home / Self Care | Payer: Medicaid Other | Source: Ambulatory Visit | Attending: Family Medicine | Admitting: Family Medicine

## 2022-03-25 DIAGNOSIS — O99019 Anemia complicating pregnancy, unspecified trimester: Secondary | ICD-10-CM | POA: Diagnosis not present

## 2022-03-25 DIAGNOSIS — D509 Iron deficiency anemia, unspecified: Secondary | ICD-10-CM | POA: Diagnosis not present

## 2022-03-25 MED ORDER — SODIUM CHLORIDE 0.9 % IV SOLN
300.0000 mg | INTRAVENOUS | Status: DC
  Administered 2022-03-25: 300 mg via INTRAVENOUS
  Filled 2022-03-25: qty 300

## 2022-03-26 DIAGNOSIS — Z419 Encounter for procedure for purposes other than remedying health state, unspecified: Secondary | ICD-10-CM | POA: Diagnosis not present

## 2022-04-02 ENCOUNTER — Ambulatory Visit (INDEPENDENT_AMBULATORY_CARE_PROVIDER_SITE_OTHER): Payer: Medicaid Other | Admitting: Family Medicine

## 2022-04-02 VITALS — BP 109/67 | HR 72 | Wt 161.0 lb

## 2022-04-02 DIAGNOSIS — Z3A23 23 weeks gestation of pregnancy: Secondary | ICD-10-CM

## 2022-04-02 DIAGNOSIS — Z86718 Personal history of other venous thrombosis and embolism: Secondary | ICD-10-CM

## 2022-04-02 DIAGNOSIS — R760 Raised antibody titer: Secondary | ICD-10-CM

## 2022-04-02 DIAGNOSIS — O09219 Supervision of pregnancy with history of pre-term labor, unspecified trimester: Secondary | ICD-10-CM

## 2022-04-02 DIAGNOSIS — O09899 Supervision of other high risk pregnancies, unspecified trimester: Secondary | ICD-10-CM

## 2022-04-02 NOTE — Progress Notes (Signed)
   PRENATAL VISIT NOTE  Subjective:  Alicia Mendez is a 26 y.o. [phone] at [redacted]w[redacted]d being seen today for ongoing prenatal care.  She is currently monitored for the following issues for this high-risk pregnancy and has Anti-cardiolipin antibody positive; Headache disorder; Acute cholecystitis due to biliary calculus; Hyponatremia; History of pulmonary embolism; History of DVT (deep vein thrombosis); Supervision of other high risk pregnancy, antepartum, unspecified trimester; History of preterm labor, current pregnancy, unspecified trimester; and History of prior pregnancy with SGA newborn on their problem list.  Patient reports no complaints.  Contractions: Not present. Vag. Bleeding: None.  Movement: Present. Denies leaking of fluid.   The following portions of the patient's history were reviewed and updated as appropriate: allergies, current medications, past family history, past medical history, past social history, past surgical history and problem list.   Objective:   Vitals:   04/02/22 1357  BP: 109/67  Pulse: 72  Weight: 161 lb (73 kg)    Fetal Status: Fetal Heart Rate (bpm): 124 Fundal Height: 23 cm Movement: Present     General:  Alert, oriented and cooperative. Patient is in no acute distress.  Skin: Skin is warm and dry. No rash noted.   Cardiovascular: Normal heart rate noted  Respiratory: Normal respiratory effort, no problems with respiration noted  Abdomen: Soft, gravid, appropriate for gestational age.  Pain/Pressure: Absent     Pelvic: Cervical exam deferred        Extremities: Normal range of motion.  Edema: None  Mental Status: Normal mood and affect. Normal behavior. Normal judgment and thought content.   Assessment and Plan:  Pregnancy: [id number] at [redacted]w[redacted]d 1. Supervision of other high risk pregnancy, antepartum, unspecified trimester  2. [redacted] weeks gestation of pregnancy FHT and Fh normal  3. History of preterm labor, current pregnancy, unspecified  trimester  4. Anti-cardiolipin antibody positive On therapeutic lovenox.  Induce at 39 weeks  5. History of DVT (deep vein thrombosis)   Preterm labor symptoms and general obstetric precautions including but not limited to vaginal bleeding, contractions, leaking of fluid and fetal movement were reviewed in detail with the patient. Please refer to After Visit Summary for other counseling recommendations.   No follow-ups on file.  Future Appointments  Date Time Provider [place]  04/03/2022  3:15 PM [facility] NURSE [facility]  04/03/2022  3:30 PM [facility] US3 [facility]  04/07/2022  9:00 AM [facility] None  04/30/2022  8:15 AM Truett Mainland, DO [facility] None  05/14/2022 11:15 AM Anyanwu, Sallyanne Havers, MD [facility] None    Truett Mainland, DO

## 2022-04-03 ENCOUNTER — Ambulatory Visit: Payer: Medicaid Other

## 2022-04-07 ENCOUNTER — Ambulatory Visit (HOSPITAL_COMMUNITY): Payer: Medicaid Other

## 2022-04-17 ENCOUNTER — Ambulatory Visit (HOSPITAL_COMMUNITY)
Admission: RE | Admit: 2022-04-17 | Discharge: 2022-04-17 | Disposition: A | Discharge status: Home / Self Care | Payer: Medicaid Other | Source: Ambulatory Visit | Attending: Family Medicine | Admitting: Family Medicine

## 2022-04-17 DIAGNOSIS — D509 Iron deficiency anemia, unspecified: Secondary | ICD-10-CM | POA: Diagnosis not present

## 2022-04-17 DIAGNOSIS — D6861 Antiphospholipid syndrome: Secondary | ICD-10-CM | POA: Insufficient documentation

## 2022-04-17 MED ORDER — DIPHENHYDRAMINE HCL 50 MG/ML IJ SOLN: 25.0000 mg | Freq: Once | INTRAMUSCULAR | Status: DC | PRN

## 2022-04-17 MED ORDER — SODIUM CHLORIDE 0.9 % IV SOLN
INTRAVENOUS | Status: DC | PRN
Start: 2022-04-17 — End: 2022-04-18

## 2022-04-17 MED ORDER — SODIUM CHLORIDE 0.9 % IV SOLN
300.0000 mg | INTRAVENOUS | Status: DC
  Administered 2022-04-17: 300 mg via INTRAVENOUS
  Filled 2022-04-17: qty 15

## 2022-04-17 MED ORDER — METHYLPREDNISOLONE SODIUM SUCC 125 MG IJ SOLR: 125.0000 mg | Freq: Once | INTRAMUSCULAR | Status: DC | PRN

## 2022-04-17 MED ORDER — SODIUM CHLORIDE 0.9 % IV BOLUS: 500.0000 mL | Freq: Once | INTRAVENOUS | Status: DC | PRN

## 2022-04-17 MED ORDER — ALBUTEROL SULFATE (2.5 MG/3ML) 0.083% IN NEBU: 2.5000 mg | INHALATION_SOLUTION | Freq: Once | RESPIRATORY_TRACT | Status: DC | PRN

## 2022-04-17 MED ORDER — EPINEPHRINE PF 1 MG/ML IJ SOLN: 0.3000 mg | Freq: Once | INTRAMUSCULAR | Status: DC | PRN

## 2022-04-25 DIAGNOSIS — Z419 Encounter for procedure for purposes other than remedying health state, unspecified: Secondary | ICD-10-CM | POA: Diagnosis not present

## 2022-04-30 ENCOUNTER — Encounter: Payer: Self-pay | Admitting: General Practice

## 2022-04-30 ENCOUNTER — Ambulatory Visit (INDEPENDENT_AMBULATORY_CARE_PROVIDER_SITE_OTHER): Payer: Medicaid Other | Admitting: Family Medicine

## 2022-04-30 VITALS — BP 113/70 | HR 80 | Wt 163.0 lb

## 2022-04-30 DIAGNOSIS — Z8759 Personal history of other complications of pregnancy, childbirth and the puerperium: Secondary | ICD-10-CM

## 2022-04-30 DIAGNOSIS — Z86711 Personal history of pulmonary embolism: Secondary | ICD-10-CM

## 2022-04-30 DIAGNOSIS — Z23 Encounter for immunization: Secondary | ICD-10-CM | POA: Diagnosis not present

## 2022-04-30 DIAGNOSIS — Z3A27 27 weeks gestation of pregnancy: Secondary | ICD-10-CM | POA: Diagnosis not present

## 2022-04-30 DIAGNOSIS — O09212 Supervision of pregnancy with history of pre-term labor, second trimester: Secondary | ICD-10-CM

## 2022-04-30 DIAGNOSIS — O09899 Supervision of other high risk pregnancies, unspecified trimester: Secondary | ICD-10-CM

## 2022-04-30 DIAGNOSIS — O09892 Supervision of other high risk pregnancies, second trimester: Secondary | ICD-10-CM | POA: Diagnosis not present

## 2022-04-30 DIAGNOSIS — O09219 Supervision of pregnancy with history of pre-term labor, unspecified trimester: Secondary | ICD-10-CM

## 2022-04-30 DIAGNOSIS — Z3492 Encounter for supervision of normal pregnancy, unspecified, second trimester: Secondary | ICD-10-CM | POA: Diagnosis not present

## 2022-04-30 DIAGNOSIS — R760 Raised antibody titer: Secondary | ICD-10-CM

## 2022-04-30 NOTE — Progress Notes (Signed)
   PRENATAL VISIT NOTE  Subjective:  Alicia Mendez is a 26 y.o. [phone] at [redacted]w[redacted]d being seen today for ongoing prenatal care.  She is currently monitored for the following issues for this high-risk pregnancy and has Anti-cardiolipin antibody positive; Headache disorder; Acute cholecystitis due to biliary calculus; Hyponatremia; History of pulmonary embolism; History of DVT (deep vein thrombosis); Supervision of other high risk pregnancy, antepartum, unspecified trimester; History of preterm labor, current pregnancy, unspecified trimester; and History of prior pregnancy with SGA newborn on their problem list.  Patient reports heartburn.  Contractions: Not present. Vag. Bleeding: None.  Movement: Present. Denies leaking of fluid.   The following portions of the patient's history were reviewed and updated as appropriate: allergies, current medications, past family history, past medical history, past social history, past surgical history and problem list.   Objective:   Vitals:   04/30/22 0817 04/30/22 0822  BP: (!) 145/98 113/70  Pulse: 84 80  Weight: 163 lb (73.9 kg)     Fetal Status: Fetal Heart Rate (bpm): 134   Movement: Present     General:  Alert, oriented and cooperative. Patient is in no acute distress.  Skin: Skin is warm and dry. No rash noted.   Cardiovascular: Normal heart rate noted  Respiratory: Normal respiratory effort, no problems with respiration noted  Abdomen: Soft, gravid, appropriate for gestational age.  Pain/Pressure: Absent     Pelvic: Cervical exam deferred        Extremities: Normal range of motion.  Edema: None  Mental Status: Normal mood and affect. Normal behavior. Normal judgment and thought content.   Assessment and Plan:  Pregnancy: [id number] at [redacted]w[redacted]d 1. [redacted] weeks gestation of pregnancy - CBC - Glucose Tolerance, 2 Hours w/1 Hour - HIV Antibody (routine testing w rflx) - RPR - Tdap vaccine greater than or equal to 7yo IM  2. Supervision of other  high risk pregnancy, antepartum, unspecified trimester FHT and FH normal. Tums, pepcid for heartburn  3. Anti-cardiolipin antibody positive On lovenox  4. History of pulmonary embolism  5. History of preterm labor, current pregnancy, unspecified trimester No sxs of labor  6. History of prior pregnancy with SGA newborn Serial growth [place]  Preterm labor symptoms and general obstetric precautions including but not limited to vaginal bleeding, contractions, leaking of fluid and fetal movement were reviewed in detail with the patient. Please refer to After Visit Summary for other counseling recommendations.   No follow-ups on file.  Future Appointments  Date Time Provider Department Center  05/01/2022  2:30 PM [facility] NURSE [facility]  05/01/2022  2:45 PM [facility] US5 [facility]  05/14/2022 11:15 AM Anyanwu, Jethro Bastos, MD [facility] None    Levie Heritage, DO

## 2022-05-01 ENCOUNTER — Ambulatory Visit (HOSPITAL_BASED_OUTPATIENT_CLINIC_OR_DEPARTMENT_OTHER): Payer: Medicaid Other

## 2022-05-01 ENCOUNTER — Ambulatory Visit: Payer: Medicaid Other | Attending: Obstetrics and Gynecology | Admitting: *Deleted

## 2022-05-01 VITALS — BP 123/77 | HR 99

## 2022-05-01 DIAGNOSIS — O99112 Other diseases of the blood and blood-forming organs and certain disorders involving the immune mechanism complicating pregnancy, second trimester: Secondary | ICD-10-CM | POA: Insufficient documentation

## 2022-05-01 DIAGNOSIS — Z3A27 27 weeks gestation of pregnancy: Secondary | ICD-10-CM

## 2022-05-01 DIAGNOSIS — Z8759 Personal history of other complications of pregnancy, childbirth and the puerperium: Secondary | ICD-10-CM

## 2022-05-01 DIAGNOSIS — Z86718 Personal history of other venous thrombosis and embolism: Secondary | ICD-10-CM | POA: Insufficient documentation

## 2022-05-01 DIAGNOSIS — O99891 Other specified diseases and conditions complicating pregnancy: Secondary | ICD-10-CM | POA: Diagnosis not present

## 2022-05-01 DIAGNOSIS — O09899 Supervision of other high risk pregnancies, unspecified trimester: Secondary | ICD-10-CM

## 2022-05-01 DIAGNOSIS — O99212 Obesity complicating pregnancy, second trimester: Secondary | ICD-10-CM | POA: Diagnosis not present

## 2022-05-01 DIAGNOSIS — O09892 Supervision of other high risk pregnancies, second trimester: Secondary | ICD-10-CM | POA: Insufficient documentation

## 2022-05-01 DIAGNOSIS — R760 Raised antibody titer: Secondary | ICD-10-CM

## 2022-05-01 DIAGNOSIS — O99119 Other diseases of the blood and blood-forming organs and certain disorders involving the immune mechanism complicating pregnancy, unspecified trimester: Secondary | ICD-10-CM | POA: Diagnosis present

## 2022-05-01 DIAGNOSIS — E669 Obesity, unspecified: Secondary | ICD-10-CM

## 2022-05-01 DIAGNOSIS — O09292 Supervision of pregnancy with other poor reproductive or obstetric history, second trimester: Secondary | ICD-10-CM

## 2022-05-01 DIAGNOSIS — Z86711 Personal history of pulmonary embolism: Secondary | ICD-10-CM | POA: Diagnosis not present

## 2022-05-01 LAB — CBC
Hematocrit: 33.7 % — ABNORMAL LOW (ref 34.0–46.6)
Hemoglobin: 11.2 g/dL (ref 11.1–15.9)
MCH: 26.4 pg — ABNORMAL LOW (ref 26.6–33.0)
MCHC: 33.2 g/dL (ref 31.5–35.7)
MCV: 79 fL (ref 79–97)
Platelets: 152 10*3/uL (ref 150–450)
RBC: 4.25 x10E6/uL (ref 3.77–5.28)
RDW: 16.6 % — ABNORMAL HIGH (ref 11.7–15.4)
WBC: 8.1 10*3/uL (ref 3.4–10.8)

## 2022-05-01 LAB — GLUCOSE TOLERANCE, 2 HOURS W/ 1HR
Glucose, 1 hour: 100 mg/dL (ref 70–179)
Glucose, 2 hour: 84 mg/dL (ref 70–152)
Glucose, Fasting: 70 mg/dL (ref 70–91)

## 2022-05-01 LAB — HIV ANTIBODY (ROUTINE TESTING W REFLEX): HIV Screen 4th Generation wRfx: NONREACTIVE

## 2022-05-01 LAB — RPR: RPR Ser Ql: NONREACTIVE

## 2022-05-04 ENCOUNTER — Other Ambulatory Visit: Payer: Self-pay | Admitting: *Deleted

## 2022-05-04 DIAGNOSIS — Z6830 Body mass index (BMI) 30.0-30.9, adult: Secondary | ICD-10-CM

## 2022-05-04 DIAGNOSIS — Z8759 Personal history of other complications of pregnancy, childbirth and the puerperium: Secondary | ICD-10-CM

## 2022-05-04 DIAGNOSIS — Z86711 Personal history of pulmonary embolism: Secondary | ICD-10-CM

## 2022-05-04 DIAGNOSIS — O99119 Other diseases of the blood and blood-forming organs and certain disorders involving the immune mechanism complicating pregnancy, unspecified trimester: Secondary | ICD-10-CM

## 2022-05-04 DIAGNOSIS — Z86718 Personal history of other venous thrombosis and embolism: Secondary | ICD-10-CM

## 2022-05-14 ENCOUNTER — Ambulatory Visit (INDEPENDENT_AMBULATORY_CARE_PROVIDER_SITE_OTHER): Payer: Medicaid Other | Admitting: Obstetrics & Gynecology

## 2022-05-14 ENCOUNTER — Encounter: Payer: Self-pay | Admitting: Obstetrics & Gynecology

## 2022-05-14 VITALS — BP 119/78 | HR 80 | Wt 165.0 lb

## 2022-05-14 DIAGNOSIS — R12 Heartburn: Secondary | ICD-10-CM

## 2022-05-14 DIAGNOSIS — D6861 Antiphospholipid syndrome: Secondary | ICD-10-CM

## 2022-05-14 DIAGNOSIS — Z3A29 29 weeks gestation of pregnancy: Secondary | ICD-10-CM

## 2022-05-14 DIAGNOSIS — Z86718 Personal history of other venous thrombosis and embolism: Secondary | ICD-10-CM

## 2022-05-14 DIAGNOSIS — O99119 Other diseases of the blood and blood-forming organs and certain disorders involving the immune mechanism complicating pregnancy, unspecified trimester: Secondary | ICD-10-CM

## 2022-05-14 DIAGNOSIS — O26893 Other specified pregnancy related conditions, third trimester: Secondary | ICD-10-CM

## 2022-05-14 DIAGNOSIS — Z86711 Personal history of pulmonary embolism: Secondary | ICD-10-CM

## 2022-05-14 DIAGNOSIS — Z8759 Personal history of other complications of pregnancy, childbirth and the puerperium: Secondary | ICD-10-CM

## 2022-05-14 DIAGNOSIS — O0993 Supervision of high risk pregnancy, unspecified, third trimester: Secondary | ICD-10-CM

## 2022-05-14 DIAGNOSIS — O09213 Supervision of pregnancy with history of pre-term labor, third trimester: Secondary | ICD-10-CM

## 2022-05-14 DIAGNOSIS — O09219 Supervision of pregnancy with history of pre-term labor, unspecified trimester: Secondary | ICD-10-CM

## 2022-05-14 MED ORDER — FAMOTIDINE 20 MG PO TABS: 20.0000 mg | ORAL_TABLET | Freq: Two times a day (BID) | ORAL | 3 refills | Status: DC

## 2022-05-14 NOTE — Progress Notes (Signed)
PRENATAL VISIT NOTE  Subjective:  Alicia Mendez is a 26 y.o. [phone] at [redacted]w[redacted]d being seen today for ongoing prenatal care.  She is currently monitored for the following issues for this high-risk pregnancy and has Anti-cardiolipin antibody positive; Headache disorder; History of pulmonary embolism; History of DVT (deep vein thrombosis); Supervision of high risk pregnancy in third trimester; Previous preterm delivery, antepartum; History of prior pregnancy with SGA newborn; and Antiphospholipid antibody syndrome complicating pregnancy (HCC) on their problem list.  Patient reports heartburn not alleviated by Tums.  Contractions: Not present. Vag. Bleeding: None.  Movement: Present. Denies leaking of fluid.   The following portions of the patient's history were reviewed and updated as appropriate: allergies, current medications, past family history, past medical history, past social history, past surgical history and problem list.   Objective:   Vitals:   05/14/22 1113  BP: 119/78  Pulse: 80  Weight: 165 lb (74.8 kg)    Fetal Status:     Movement: Present     General:  Alert, oriented and cooperative. Patient is in no acute distress.  Skin: Skin is warm and dry. No rash noted.   Cardiovascular: Normal heart rate noted  Respiratory: Normal respiratory effort, no problems with respiration noted  Abdomen: Soft, gravid, appropriate for gestational age.  Pain/Pressure: Present     Pelvic: Cervical exam deferred        Extremities: Normal range of motion.  Edema: None  Mental Status: Normal mood and affect. Normal behavior. Normal judgment and thought content.   Imaging: [place] MFM OB FOLLOW UP  Result Date: 05/01/2022 ----------------------------------------------------------------------  OBSTETRICS REPORT                       (Signed Final 05/01/2022 06:07 pm) ---------------------------------------------------------------------- Patient Info  ID #:       [id number]                           D.O.B.:  April 30, 1996 (26 yrs)  Name:       Alicia Mendez             Visit Date: 05/01/2022 03:05 pm ---------------------------------------------------------------------- Performed By  Attending:        Ma Rings MD         Secondary Phy.:   [facility]  Performed By:     Charlyne Petrin     Address:          [address]                    [place]                                                             [address]  Referred By:      Levie Heritage        [facility]                    MD  [facility] at                                                             [facility]                                                             [facility]  Ref. Address:     [address]                    [place]                    [id number] ---------------------------------------------------------------------- Orders  #  Description                           Code        Ordered By  1  [place] MFM OB FOLLOW UP                   [phone]    Noralee Space ----------------------------------------------------------------------  #  Order #                     Accession #                Episode #  1  [id number]                   [id number]                 [id number] ---------------------------------------------------------------------- Indications  Antiphospholipid syndrome complicating         O99.119, D68.61  pregnancy, antepartum  Medical complication of pregnancy (Hx DVT,     O26.90  PE, SGA infant)  Obesity complicating pregnancy, second         O99.212  trimester (BMI 30)  LR NIPS  [redacted] weeks gestation of pregnancy                Z3A.27 ---------------------------------------------------------------------- Vital Signs  BP:          123/77 ---------------------------------------------------------------------- Fetal Evaluation  Num Of Fetuses:         1  Fetal Heart Rate(bpm):  141  Cardiac Activity:       Observed  Presentation:            Breech  Placenta:               Posterior  P. Cord Insertion:      Previously Visualized  Amniotic Fluid  AFI FV:      Within normal limits                              Largest Pocket(cm)                              3.99 ---------------------------------------------------------------------- Biometry  BPD:      73.8  mm     G. Age:  29w 4d         91  %    CI:        76.97   %    70 - 86                                                          FL/HC:      19.3   %    18.8 - 20.6  HC:      266.4  mm     G. Age:  29w 0d         62  %    HC/AC:      1.16        1.05 - 1.21  AC:    229.12   mm     G. Age:  27w 2d         29  %    FL/BPD:     69.7   %    71 - 87  FL:      51.47  mm     G. Age:  27w 4d         29  %    FL/AC:      22.5   %    20 - 24  HUM:      47.3  mm     G. Age:  27w 6d         49  %  CER:      31.6  mm     G. Age:  27w 2d         41  %  LV:        3.4  mm  CM:        5.9  mm  Est. FW:    1109  gm      2 lb 7 oz     35  % ---------------------------------------------------------------------- OB History  Blood Type:   AB+  Gravidity:    9         Term:   2        Prem:   2        SAB:   4  TOP:          0       Ectopic:  0        Living: 4 ---------------------------------------------------------------------- Gestational Age  LMP:           29w 6d        Date:  10/04/21                 EDD:   07/11/22  U/S Today:     28w 3d                                        EDD:   07/21/22  Best:          27w 5d     Det. By:  Previous Ultrasound      EDD:   07/26/22                                      (  12/08/21) ---------------------------------------------------------------------- Anatomy  Cranium:               Appears normal         Aortic Arch:            Previously seen  Cavum:                 Appears normal         Ductal Arch:            Previously seen  Ventricles:            Appears normal         Diaphragm:              Appears normal  Choroid Plexus:        Previously seen        Stomach:                 Appears normal, left                                                                        sided  Cerebellum:            Appears normal         Abdomen:                Appears normal  Posterior Fossa:       Appears normal         Abdominal Wall:         Previously seen  Nuchal Fold:           Not applicable (>20    Cord Vessels:           Appears normal ([redacted]                         wks GA)                                        vessel cord)  Face:                  Appears normal         Kidneys:                Appear normal                         (orbits and profile)  Lips:                  Appears normal         Bladder:                Appears normal  Palate:                Previously seen        Spine:                  Not well visualized  Thoracic:              Appears normal  Upper Extremities:      Previously seen  RVOT:                  Appears normal         Lower Extremities:      Previously seen  LVOT:                  Appears normal  Other:  3VV and 3VTV visualized.Fetus appears to be a female. Technically          difficult due to fetal position. ---------------------------------------------------------------------- Cervix Uterus Adnexa  Cervix  Not visualized (advanced GA >24wks)  Uterus  Normal shape and size.  Right Ovary  Within normal limits.  Left Ovary  Within normal limits. ---------------------------------------------------------------------- Comments  This patient was seen for a follow up growth scan due to the  antiphospholipid antibody (APA) syndrome.  She is treated  with Lovenox 100 mg twice a day.  She denies any problems  since her last exam.  She was informed that the fetal growth and amniotic fluid  level appears appropriate for her gestational age.  Due to the APA syndrome, we will start weekly fetal testing at  32 weeks.  A follow-up growth scan and BPP was scheduled in 4 weeks. ----------------------------------------------------------------------                  Johnell Comings, MD  Electronically Signed Final Report   05/01/2022 06:07 pm ----------------------------------------------------------------------   Assessment and Plan:  Pregnancy: [id number] at [redacted]w[redacted]d 1. Antiphospholipid antibody syndrome complicating pregnancy ([place]) 2. History of pulmonary embolism 3. History of DVT (deep vein thrombosis) Continue Lovenox. Continue antenatal surveillance and scans as per MFM.  4. Previous preterm delivery, antepartum PTL precautions reviewed.   5. History of prior pregnancy with SGA newborn Follow up serial growth scans  6. Heartburn during pregnancy in third trimester Pepcid prescribed, will monitor response. - famotidine (PEPCID) 20 MG tablet; Take 1 tablet (20 mg total) by mouth 2 (two) times daily.  Dispense: 60 tablet; Refill: 3  7. [redacted] weeks gestation of pregnancy 8. Supervision of high risk pregnancy in third trimester Normal third trimester labs, reviewed with patient.  Preterm labor symptoms and general obstetric precautions including but not limited to vaginal bleeding, contractions, leaking of fluid and fetal movement were reviewed in detail with the patient. Please refer to After Visit Summary for other counseling recommendations.   Return in about 15 days (around 05/29/2022) for OFFICE OB VISIT (MD only) - can be virtual.  Future Appointments  Date Time Provider [place]  05/29/2022 11:15 AM Cristoval Teall, Sallyanne Havers, MD [facility] None  05/29/2022  2:15 PM [facility] NURSE [facility]  05/29/2022  2:30 PM [facility] US2 [facility]  06/05/2022  3:30 PM [facility] NURSE [facility]  06/05/2022  3:45 PM [facility] US5 [facility]  06/11/2022 11:15 AM Truett Mainland, DO [facility] None  06/12/2022  3:30 PM [facility] NURSE [facility]  06/12/2022  3:45 PM [facility] US5 [facility]  06/25/2022  1:50 PM Truett Mainland, DO [facility] None  07/02/2022  9:55 AM Truett Mainland, DO [facility] None  07/10/2022 10:55 AM Truett Mainland, DO [facility] None  07/17/2022 10:35 AM Nehemiah Settle, Tanna Savoy,  DO [facility] None    Verita Schneiders, MD

## 2022-05-14 NOTE — Patient Instructions (Signed)
Return to office for any scheduled appointments. Call the office or go to the MAU at [facility] if: You begin to have strong, frequent contractions Your water breaks.  Sometimes it is a big gush of fluid, sometimes it is just a trickle that keeps getting your underwear wet or running down your legs You have vaginal bleeding.  It is normal to have a small amount of spotting if your cervix was checked.  You do not feel your baby moving like normal.  If you do not, get something to eat and drink and lay down and focus on feeling your baby move.   If your baby is still not moving like normal, you should call the office or go to MAU. Any other obstetric concerns.  

## 2022-05-15 DIAGNOSIS — D500 Iron deficiency anemia secondary to blood loss (chronic): Secondary | ICD-10-CM | POA: Diagnosis not present

## 2022-05-15 DIAGNOSIS — D6861 Antiphospholipid syndrome: Secondary | ICD-10-CM | POA: Diagnosis not present

## 2022-05-15 DIAGNOSIS — I2699 Other pulmonary embolism without acute cor pulmonale: Secondary | ICD-10-CM | POA: Diagnosis not present

## 2022-05-26 DIAGNOSIS — Z419 Encounter for procedure for purposes other than remedying health state, unspecified: Secondary | ICD-10-CM | POA: Diagnosis not present

## 2022-05-29 ENCOUNTER — Ambulatory Visit (INDEPENDENT_AMBULATORY_CARE_PROVIDER_SITE_OTHER): Payer: Medicaid Other | Admitting: Obstetrics & Gynecology

## 2022-05-29 ENCOUNTER — Other Ambulatory Visit: Payer: Self-pay | Admitting: *Deleted

## 2022-05-29 ENCOUNTER — Ambulatory Visit: Payer: Medicaid Other | Admitting: *Deleted

## 2022-05-29 ENCOUNTER — Ambulatory Visit: Payer: Medicaid Other | Attending: Obstetrics & Gynecology

## 2022-05-29 VITALS — BP 113/81 | HR 98 | Wt 167.0 lb

## 2022-05-29 VITALS — BP 118/79 | HR 70

## 2022-05-29 DIAGNOSIS — Z86711 Personal history of pulmonary embolism: Secondary | ICD-10-CM | POA: Diagnosis not present

## 2022-05-29 DIAGNOSIS — O09293 Supervision of pregnancy with other poor reproductive or obstetric history, third trimester: Secondary | ICD-10-CM

## 2022-05-29 DIAGNOSIS — Z7902 Long term (current) use of antithrombotics/antiplatelets: Secondary | ICD-10-CM | POA: Diagnosis not present

## 2022-05-29 DIAGNOSIS — D6861 Antiphospholipid syndrome: Secondary | ICD-10-CM

## 2022-05-29 DIAGNOSIS — Z3A31 31 weeks gestation of pregnancy: Secondary | ICD-10-CM | POA: Diagnosis not present

## 2022-05-29 DIAGNOSIS — O0993 Supervision of high risk pregnancy, unspecified, third trimester: Secondary | ICD-10-CM

## 2022-05-29 DIAGNOSIS — O99113 Other diseases of the blood and blood-forming organs and certain disorders involving the immune mechanism complicating pregnancy, third trimester: Secondary | ICD-10-CM | POA: Diagnosis not present

## 2022-05-29 DIAGNOSIS — O99119 Other diseases of the blood and blood-forming organs and certain disorders involving the immune mechanism complicating pregnancy, unspecified trimester: Secondary | ICD-10-CM

## 2022-05-29 DIAGNOSIS — O09219 Supervision of pregnancy with history of pre-term labor, unspecified trimester: Secondary | ICD-10-CM

## 2022-05-29 DIAGNOSIS — Z86718 Personal history of other venous thrombosis and embolism: Secondary | ICD-10-CM | POA: Insufficient documentation

## 2022-05-29 DIAGNOSIS — Z8759 Personal history of other complications of pregnancy, childbirth and the puerperium: Secondary | ICD-10-CM

## 2022-05-29 DIAGNOSIS — Z6830 Body mass index (BMI) 30.0-30.9, adult: Secondary | ICD-10-CM | POA: Diagnosis not present

## 2022-05-29 DIAGNOSIS — Z362 Encounter for other antenatal screening follow-up: Secondary | ICD-10-CM | POA: Insufficient documentation

## 2022-05-29 DIAGNOSIS — O99891 Other specified diseases and conditions complicating pregnancy: Secondary | ICD-10-CM

## 2022-05-29 NOTE — Patient Instructions (Signed)
Return to office for any scheduled appointments. Call the office or go to the MAU at [facility] [place] if: You begin to have strong, frequent contractions Your water breaks.  Sometimes it is a big gush of fluid, sometimes it is just a trickle that keeps getting your underwear wet or running down your legs You have vaginal bleeding.  It is normal to have a small amount of spotting if your cervix was checked.  You do not feel your baby moving like normal.  If you do not, get something to eat and drink and lay down and focus on feeling your baby move.   If your baby is still not moving like normal, you should call the office or go to MAU. Any other obstetric concerns.  

## 2022-05-29 NOTE — Progress Notes (Signed)
Pt reports itching on her arms, hands, feet and legs for the past 2-3 weeks. She did not think to let her Dr. Ashley Mariner today.

## 2022-05-29 NOTE — Progress Notes (Signed)
PRENATAL VISIT NOTE  Subjective:  Alicia Mendez is a 26 y.o. [phone] at [redacted]w[redacted]d being seen today for ongoing prenatal care.  She is currently monitored for the following issues for this high-risk pregnancy and has Anti-cardiolipin antibody positive; Headache disorder; History of pulmonary embolism; History of DVT (deep vein thrombosis); Supervision of high risk pregnancy in third trimester; Previous preterm delivery, antepartum; History of prior pregnancy with SGA newborn; and Antiphospholipid antibody syndrome complicating pregnancy (HCC) on their problem list.  Patient reports no complaints.  Contractions: Not present. Vag. Bleeding: None.  Movement: Present. Denies leaking of fluid.   The following portions of the patient's history were reviewed and updated as appropriate: allergies, current medications, past family history, past medical history, past social history, past surgical history and problem list.   Objective:   Vitals:   05/29/22 1123  BP: 113/81  Pulse: 98  Weight: 167 lb (75.8 kg)    Fetal Status: Fetal Heart Rate (bpm): 141   Movement: Present     General:  Alert, oriented and cooperative. Patient is in no acute distress.  Skin: Skin is warm and dry. No rash noted.   Cardiovascular: Normal heart rate noted  Respiratory: Normal respiratory effort, no problems with respiration noted  Abdomen: Soft, gravid, appropriate for gestational age.  Pain/Pressure: Present     Pelvic: Cervical exam deferred        Extremities: Normal range of motion.  Edema: None  Mental Status: Normal mood and affect. Normal behavior. Normal judgment and thought content.   [place] MFM OB FOLLOW UP  Result Date: 05/01/2022 ----------------------------------------------------------------------  OBSTETRICS REPORT                       (Signed Final 05/01/2022 06:07 pm) ---------------------------------------------------------------------- Patient Info  ID #:       [id number]                           D.O.B.:  [birth year] (26 yrs)  Name:       Alicia Mendez [facility]             Visit Date: 05/01/2022 03:05 pm ---------------------------------------------------------------------- Performed By  Attending:        Ma Rings MD         Secondary Phy.:   [facility]  Performed By:     Charlyne Petrin     Address:          [address]                    [place]                                                             Rd  Referred By:      Levie Heritage        [facility]                    MD  [facility] Care at                                                             [facility]                                                             [facility]  Ref. Address:     [address]                    [place]                    [address] ---------------------------------------------------------------------- Orders  #  Description                           Code        Ordered By  1  [place] MFM OB FOLLOW UP                   [phone]    Noralee Space ----------------------------------------------------------------------  #  Order #                     Accession #                Episode #  1  [id number]                   [id number]                 [id number] ---------------------------------------------------------------------- Indications  Antiphospholipid syndrome complicating         O99.119, D68.61  pregnancy, antepartum  Medical complication of pregnancy (Hx DVT,     O26.90  PE, SGA infant)  Obesity complicating pregnancy, second         O99.212  trimester (BMI 30)  LR NIPS  [redacted] weeks gestation of pregnancy                Z3A.27 ---------------------------------------------------------------------- Vital Signs  BP:          123/77 ---------------------------------------------------------------------- Fetal Evaluation  Num Of Fetuses:         1  Fetal Heart Rate(bpm):  141  Cardiac Activity:       Observed  Presentation:            Breech  Placenta:               Posterior  P. Cord Insertion:      Previously Visualized  Amniotic Fluid  AFI FV:      Within normal limits                              Largest Pocket(cm)                              3.99 ---------------------------------------------------------------------- Biometry  BPD:      73.8  mm     G. Age:  29w 4d         91  %    CI:        76.97   %    70 - 86                                                          FL/HC:      19.3   %    18.8 - 20.6  HC:      266.4  mm     G. Age:  29w 0d         62  %    HC/AC:      1.16        1.05 - 1.21  AC:    229.12   mm     G. Age:  27w 2d         29  %    FL/BPD:     69.7   %    71 - 87  FL:      51.47  mm     G. Age:  27w 4d         29  %    FL/AC:      22.5   %    20 - 24  HUM:      47.3  mm     G. Age:  27w 6d         49  %  CER:      31.6  mm     G. Age:  27w 2d         41  %  LV:        3.4  mm  CM:        5.9  mm  Est. FW:    1109  gm      2 lb 7 oz     35  % ---------------------------------------------------------------------- OB History  Blood Type:   AB+  Gravidity:    9         Term:   2        Prem:   2        SAB:   4  TOP:          0       Ectopic:  0        Living: 4 ---------------------------------------------------------------------- Gestational Age  LMP:           29w 6d        Date:  10/04/21                 EDD:   07/11/22  U/S Today:     28w 3d                                        EDD:   07/21/22  Best:          27w 5d     Det. By:  Previous Ultrasound      EDD:   07/26/22                                      (  12/08/21) ---------------------------------------------------------------------- Anatomy  Cranium:               Appears normal         Aortic Arch:            Previously seen  Cavum:                 Appears normal         Ductal Arch:            Previously seen  Ventricles:            Appears normal         Diaphragm:              Appears normal  Choroid Plexus:        Previously seen        Stomach:                 Appears normal, left                                                                        sided  Cerebellum:            Appears normal         Abdomen:                Appears normal  Posterior Fossa:       Appears normal         Abdominal Wall:         Previously seen  Nuchal Fold:           Not applicable (>20    Cord Vessels:           Appears normal ([redacted]                         wks GA)                                        vessel cord)  Face:                  Appears normal         Kidneys:                Appear normal                         (orbits and profile)  Lips:                  Appears normal         Bladder:                Appears normal  Palate:                Previously seen        Spine:                  Not well visualized  Thoracic:              Appears normal  Upper Extremities:      Previously seen  RVOT:                  Appears normal         Lower Extremities:      Previously seen  LVOT:                  Appears normal  Other:  3VV and 3VTV visualized.Fetus appears to be a female. Technically          difficult due to fetal position. ---------------------------------------------------------------------- Cervix Uterus Adnexa  Cervix  Not visualized (advanced GA >24wks)  Uterus  Normal shape and size.  Right Ovary  Within normal limits.  Left Ovary  Within normal limits. ---------------------------------------------------------------------- Comments  This patient was seen for a follow up growth scan due to the  antiphospholipid antibody (APA) syndrome.  She is treated  with Lovenox 100 mg twice a day.  She denies any problems  since her last exam.  She was informed that the fetal growth and amniotic fluid  level appears appropriate for her gestational age.  Due to the APA syndrome, we will start weekly fetal testing at  32 weeks.  A follow-up growth scan and BPP was scheduled in 4 weeks. ----------------------------------------------------------------------                  Ma Rings, MD  Electronically Signed Final Report   05/01/2022 06:07 pm ----------------------------------------------------------------------   Assessment and Plan:  Pregnancy: [id number] at [redacted]w[redacted]d 1. Antiphospholipid antibody syndrome complicating pregnancy (HCC) 2. History of DVT (deep vein thrombosis) 3. History of pulmonary embolism Continue Lovenox as prescribed. Followed by Hematology. Continue with planned antenatal surveillance, scans, delivery plan as per MFM.   4. History of prior pregnancy with SGA newborn Normal fetal growth recently. Continue scans as per MFM.   5. Previous preterm delivery, antepartum PTL precautions reviewed.  6. [redacted] weeks gestation of pregnancy 7. Supervision of high risk pregnancy in third trimester Normal third trimester labs reviewed with patient.  Preterm labor symptoms and general obstetric precautions including but not limited to vaginal bleeding, contractions, leaking of fluid and fetal movement were reviewed in detail with the patient. Please refer to After Visit Summary for other counseling recommendations.   Return in about 2 weeks (around 06/12/2022) for OFFICE OB VISIT (MD only) - can be virtual.  Future Appointments  Date Time Provider Department Center  05/29/2022  2:15 PM [facility] NURSE [facility]  05/29/2022  2:30 PM [facility] US2 [facility]  06/05/2022  3:30 PM [facility] NURSE [facility]  06/05/2022  3:45 PM [facility] US5 [facility]  06/11/2022 11:15 AM Levie Heritage, DO [facility] None  06/12/2022  3:30 PM [facility] NURSE [facility]  06/12/2022  3:45 PM [facility] US5 [facility]  06/25/2022  1:50 PM Levie Heritage, DO [facility] None  07/02/2022  9:55 AM Levie Heritage, DO [facility] None  07/10/2022 10:55 AM Levie Heritage, DO [facility] None  07/17/2022 10:35 AM Adrian Blackwater, Rhona Raider, DO [facility] None    Jaynie Collins, MD

## 2022-06-01 ENCOUNTER — Encounter: Payer: Self-pay | Admitting: Family Medicine

## 2022-06-01 MED ORDER — HYDROXYZINE PAMOATE 25 MG PO CAPS: 25.0000 mg | ORAL_CAPSULE | Freq: Three times a day (TID) | ORAL | 0 refills | Status: DC | PRN

## 2022-06-02 ENCOUNTER — Other Ambulatory Visit: Payer: Medicaid Other

## 2022-06-02 DIAGNOSIS — L299 Pruritus, unspecified: Secondary | ICD-10-CM | POA: Diagnosis not present

## 2022-06-03 ENCOUNTER — Encounter: Payer: Self-pay | Admitting: Family Medicine

## 2022-06-03 LAB — COMPREHENSIVE METABOLIC PANEL WITH GFR
ALT: 12 IU/L (ref 0–32)
AST: 11 IU/L (ref 0–40)
Albumin/Globulin Ratio: 1.2 (ref 1.2–2.2)
Albumin: 3.6 g/dL — ABNORMAL LOW (ref 4.0–5.0)
Alkaline Phosphatase: 123 IU/L — ABNORMAL HIGH (ref 44–121)
BUN/Creatinine Ratio: 15 (ref 9–23)
BUN: 10 mg/dL (ref 6–20)
Bilirubin Total: <0.2 mg/dL (ref 0.0–1.2)
CO2: 16 mmol/L — ABNORMAL LOW (ref 20–29)
Calcium: 8.9 mg/dL (ref 8.7–10.2)
Chloride: 99 mmol/L (ref 96–106)
Creatinine, Ser: 0.65 mg/dL (ref 0.57–1.00)
Globulin, Total: 3 g/dL (ref 1.5–4.5)
Glucose: 94 mg/dL (ref 70–99)
Potassium: 3.8 mmol/L (ref 3.5–5.2)
Sodium: 134 mmol/L (ref 134–144)
Total Protein: 6.6 g/dL (ref 6.0–8.5)
eGFR: 123 mL/min/1.73

## 2022-06-03 LAB — BILE ACIDS, TOTAL: Bile Acids Total: 6.1 umol/L (ref 0.0–10.0)

## 2022-06-05 ENCOUNTER — Ambulatory Visit: Payer: Medicaid Other | Attending: Family Medicine

## 2022-06-05 ENCOUNTER — Ambulatory Visit: Payer: Medicaid Other | Admitting: *Deleted

## 2022-06-05 VITALS — BP 120/70 | HR 85

## 2022-06-05 DIAGNOSIS — Z86711 Personal history of pulmonary embolism: Secondary | ICD-10-CM | POA: Diagnosis not present

## 2022-06-05 DIAGNOSIS — Z6830 Body mass index (BMI) 30.0-30.9, adult: Secondary | ICD-10-CM | POA: Insufficient documentation

## 2022-06-05 DIAGNOSIS — D6861 Antiphospholipid syndrome: Secondary | ICD-10-CM | POA: Diagnosis not present

## 2022-06-05 DIAGNOSIS — O0993 Supervision of high risk pregnancy, unspecified, third trimester: Secondary | ICD-10-CM | POA: Diagnosis not present

## 2022-06-05 DIAGNOSIS — O99891 Other specified diseases and conditions complicating pregnancy: Secondary | ICD-10-CM

## 2022-06-05 DIAGNOSIS — Z8759 Personal history of other complications of pregnancy, childbirth and the puerperium: Secondary | ICD-10-CM | POA: Insufficient documentation

## 2022-06-05 DIAGNOSIS — Z3A32 32 weeks gestation of pregnancy: Secondary | ICD-10-CM

## 2022-06-05 DIAGNOSIS — Z86718 Personal history of other venous thrombosis and embolism: Secondary | ICD-10-CM | POA: Insufficient documentation

## 2022-06-05 DIAGNOSIS — O358XX0 Maternal care for other (suspected) fetal abnormality and damage, not applicable or unspecified: Secondary | ICD-10-CM | POA: Diagnosis not present

## 2022-06-05 DIAGNOSIS — O99113 Other diseases of the blood and blood-forming organs and certain disorders involving the immune mechanism complicating pregnancy, third trimester: Secondary | ICD-10-CM | POA: Diagnosis not present

## 2022-06-05 DIAGNOSIS — O99119 Other diseases of the blood and blood-forming organs and certain disorders involving the immune mechanism complicating pregnancy, unspecified trimester: Secondary | ICD-10-CM | POA: Insufficient documentation

## 2022-06-11 ENCOUNTER — Telehealth: Payer: Medicaid Other | Admitting: Family Medicine

## 2022-06-11 NOTE — Progress Notes (Signed)
Patient did not keep appt.

## 2022-06-11 NOTE — Progress Notes (Signed)
Called patient to begin visit - uable to leave message. Armandina Stammer RN

## 2022-06-12 ENCOUNTER — Ambulatory Visit: Payer: Medicaid Other

## 2022-06-12 ENCOUNTER — Other Ambulatory Visit: Payer: Self-pay

## 2022-06-17 ENCOUNTER — Ambulatory Visit: Payer: Medicaid Other

## 2022-06-19 ENCOUNTER — Ambulatory Visit (HOSPITAL_BASED_OUTPATIENT_CLINIC_OR_DEPARTMENT_OTHER): Payer: Medicaid Other

## 2022-06-19 ENCOUNTER — Other Ambulatory Visit: Payer: Self-pay | Admitting: *Deleted

## 2022-06-19 ENCOUNTER — Other Ambulatory Visit: Payer: Self-pay | Admitting: Obstetrics and Gynecology

## 2022-06-19 ENCOUNTER — Ambulatory Visit: Payer: Medicaid Other | Attending: Obstetrics and Gynecology | Admitting: *Deleted

## 2022-06-19 VITALS — BP 119/72 | HR 88

## 2022-06-19 DIAGNOSIS — O0993 Supervision of high risk pregnancy, unspecified, third trimester: Secondary | ICD-10-CM

## 2022-06-19 DIAGNOSIS — Z3A34 34 weeks gestation of pregnancy: Secondary | ICD-10-CM

## 2022-06-19 DIAGNOSIS — O365930 Maternal care for other known or suspected poor fetal growth, third trimester, not applicable or unspecified: Secondary | ICD-10-CM

## 2022-06-19 DIAGNOSIS — O99119 Other diseases of the blood and blood-forming organs and certain disorders involving the immune mechanism complicating pregnancy, unspecified trimester: Secondary | ICD-10-CM | POA: Diagnosis not present

## 2022-06-19 DIAGNOSIS — O365990 Maternal care for other known or suspected poor fetal growth, unspecified trimester, not applicable or unspecified: Secondary | ICD-10-CM

## 2022-06-19 DIAGNOSIS — D6861 Antiphospholipid syndrome: Secondary | ICD-10-CM

## 2022-06-19 DIAGNOSIS — O99113 Other diseases of the blood and blood-forming organs and certain disorders involving the immune mechanism complicating pregnancy, third trimester: Secondary | ICD-10-CM | POA: Insufficient documentation

## 2022-06-19 DIAGNOSIS — Z86718 Personal history of other venous thrombosis and embolism: Secondary | ICD-10-CM | POA: Insufficient documentation

## 2022-06-25 ENCOUNTER — Ambulatory Visit (INDEPENDENT_AMBULATORY_CARE_PROVIDER_SITE_OTHER): Payer: Medicaid Other | Admitting: Family Medicine

## 2022-06-25 VITALS — BP 121/79 | HR 79 | Wt 169.0 lb

## 2022-06-25 DIAGNOSIS — D6861 Antiphospholipid syndrome: Secondary | ICD-10-CM

## 2022-06-25 DIAGNOSIS — Z86711 Personal history of pulmonary embolism: Secondary | ICD-10-CM

## 2022-06-25 DIAGNOSIS — O0993 Supervision of high risk pregnancy, unspecified, third trimester: Secondary | ICD-10-CM

## 2022-06-25 DIAGNOSIS — O99119 Other diseases of the blood and blood-forming organs and certain disorders involving the immune mechanism complicating pregnancy, unspecified trimester: Secondary | ICD-10-CM

## 2022-06-25 DIAGNOSIS — Z3A35 35 weeks gestation of pregnancy: Secondary | ICD-10-CM

## 2022-06-25 DIAGNOSIS — O09213 Supervision of pregnancy with history of pre-term labor, third trimester: Secondary | ICD-10-CM

## 2022-06-25 DIAGNOSIS — O365931 Maternal care for other known or suspected poor fetal growth, third trimester, fetus 1: Secondary | ICD-10-CM

## 2022-06-25 DIAGNOSIS — O09219 Supervision of pregnancy with history of pre-term labor, unspecified trimester: Secondary | ICD-10-CM

## 2022-06-25 DIAGNOSIS — O365990 Maternal care for other known or suspected poor fetal growth, unspecified trimester, not applicable or unspecified: Secondary | ICD-10-CM

## 2022-06-25 DIAGNOSIS — Z8759 Personal history of other complications of pregnancy, childbirth and the puerperium: Secondary | ICD-10-CM

## 2022-06-25 NOTE — Progress Notes (Signed)
   PRENATAL VISIT NOTE  Subjective:  Alicia Mendez is a 26 y.o. [phone] at [redacted]w[redacted]d being seen today for ongoing prenatal care.  She is currently monitored for the following issues for this high-risk pregnancy and has Anti-cardiolipin antibody positive; Headache disorder; History of pulmonary embolism; History of DVT (deep vein thrombosis); Supervision of high risk pregnancy in third trimester; Previous preterm delivery, antepartum; History of prior pregnancy with SGA newborn; and Antiphospholipid antibody syndrome complicating pregnancy (HCC) on their problem list.  Patient reports no complaints.  Contractions: Not present. Vag. Bleeding: None.  Movement: Present. Denies leaking of fluid.   The following portions of the patient's history were reviewed and updated as appropriate: allergies, current medications, past family history, past medical history, past social history, past surgical history and problem list.   Objective:   Vitals:   06/25/22 1400  BP: 121/79  Pulse: 79  Weight: 169 lb (76.7 kg)    Fetal Status: Fetal Heart Rate (bpm): 130   Movement: Present     General:  Alert, oriented and cooperative. Patient is in no acute distress.  Skin: Skin is warm and dry. No rash noted.   Cardiovascular: Normal heart rate noted  Respiratory: Normal respiratory effort, no problems with respiration noted  Abdomen: Soft, gravid, appropriate for gestational age.  Pain/Pressure: Present     Pelvic: Cervical exam deferred        Extremities: Normal range of motion.  Edema: None  Mental Status: Normal mood and affect. Normal behavior. Normal judgment and thought content.   Assessment and Plan:  Pregnancy: [id number] at [redacted]w[redacted]d 1. [redacted] weeks gestation of pregnancy  2. Supervision of high risk pregnancy in third trimester FHT and FH normal  3. Previous preterm delivery, antepartum  4. History of pulmonary embolism Has antiphospholipid with anticardiolipin protein Lifetime  anticoagulation. Change to heparin next week due to history of PTL Will need anticoagulation postpartum - breastfeeds, so will likely need to be on coumadin, although had difficulty maintaining stable INR in the past.  5. History of prior pregnancy with SGA newborn  6. Antiphospholipid antibody syndrome complicating pregnancy (HCC)  7. Fetal growth restriction antepartum FGR.  In antenatal testing.  Preterm labor symptoms and general obstetric precautions including but not limited to vaginal bleeding, contractions, leaking of fluid and fetal movement were reviewed in detail with the patient. Please refer to After Visit Summary for other counseling recommendations.   No follow-ups on file.  Future Appointments  Date Time Provider Department Center  06/26/2022  2:30 PM [facility] NURSE [facility]  06/26/2022  2:45 PM [facility] US6 [facility]  07/02/2022  9:55 AM Levie Heritage, DO [facility] None  07/03/2022  1:15 PM [facility] NURSE [facility]  07/03/2022  1:30 PM [facility] US2 [facility]  07/10/2022 10:55 AM Levie Heritage, DO [facility] None  07/10/2022 12:30 PM [facility] NURSE [facility]  07/10/2022 12:45 PM [facility] US4 [facility]  07/17/2022 10:35 AM Adrian Blackwater, Rhona Raider, DO [facility] None    Levie Heritage, DO

## 2022-06-26 ENCOUNTER — Ambulatory Visit: Payer: Medicaid Other | Attending: Obstetrics and Gynecology

## 2022-06-26 ENCOUNTER — Ambulatory Visit: Payer: Medicaid Other

## 2022-06-26 DIAGNOSIS — Z419 Encounter for procedure for purposes other than remedying health state, unspecified: Secondary | ICD-10-CM | POA: Diagnosis not present

## 2022-06-27 ENCOUNTER — Inpatient Hospital Stay (HOSPITAL_COMMUNITY)
Admission: AD | Admit: 2022-06-27 | Discharge: 2022-06-29 | DRG: 806 | Disposition: A | Discharge status: Home / Self Care | Payer: Medicaid Other | Attending: Obstetrics and Gynecology | Admitting: Obstetrics and Gynecology

## 2022-06-27 ENCOUNTER — Encounter (HOSPITAL_COMMUNITY): Payer: Self-pay | Admitting: Obstetrics and Gynecology

## 2022-06-27 ENCOUNTER — Inpatient Hospital Stay (HOSPITAL_COMMUNITY): Payer: Medicaid Other | Admitting: Anesthesiology

## 2022-06-27 ENCOUNTER — Other Ambulatory Visit: Payer: Self-pay

## 2022-06-27 ENCOUNTER — Inpatient Hospital Stay (HOSPITAL_BASED_OUTPATIENT_CLINIC_OR_DEPARTMENT_OTHER): Payer: Medicaid Other

## 2022-06-27 DIAGNOSIS — Z7901 Long term (current) use of anticoagulants: Secondary | ICD-10-CM | POA: Diagnosis not present

## 2022-06-27 DIAGNOSIS — O4103X0 Oligohydramnios, third trimester, not applicable or unspecified: Secondary | ICD-10-CM | POA: Diagnosis not present

## 2022-06-27 DIAGNOSIS — Z880 Allergy status to penicillin: Secondary | ICD-10-CM

## 2022-06-27 DIAGNOSIS — Z86718 Personal history of other venous thrombosis and embolism: Secondary | ICD-10-CM

## 2022-06-27 DIAGNOSIS — O4693 Antepartum hemorrhage, unspecified, third trimester: Secondary | ICD-10-CM

## 2022-06-27 DIAGNOSIS — O208 Other hemorrhage in early pregnancy: Secondary | ICD-10-CM | POA: Diagnosis not present

## 2022-06-27 DIAGNOSIS — O365930 Maternal care for other known or suspected poor fetal growth, third trimester, not applicable or unspecified: Secondary | ICD-10-CM | POA: Diagnosis present

## 2022-06-27 DIAGNOSIS — D6861 Antiphospholipid syndrome: Secondary | ICD-10-CM | POA: Diagnosis not present

## 2022-06-27 DIAGNOSIS — Z86711 Personal history of pulmonary embolism: Secondary | ICD-10-CM | POA: Diagnosis not present

## 2022-06-27 DIAGNOSIS — Z3A35 35 weeks gestation of pregnancy: Secondary | ICD-10-CM

## 2022-06-27 DIAGNOSIS — O0993 Supervision of high risk pregnancy, unspecified, third trimester: Principal | ICD-10-CM

## 2022-06-27 DIAGNOSIS — O09293 Supervision of pregnancy with other poor reproductive or obstetric history, third trimester: Secondary | ICD-10-CM

## 2022-06-27 DIAGNOSIS — O99113 Other diseases of the blood and blood-forming organs and certain disorders involving the immune mechanism complicating pregnancy, third trimester: Secondary | ICD-10-CM | POA: Diagnosis not present

## 2022-06-27 DIAGNOSIS — Z743 Need for continuous supervision: Secondary | ICD-10-CM | POA: Diagnosis not present

## 2022-06-27 LAB — TYPE AND SCREEN
ABO/RH(D): AB POS
Antibody Screen: NEGATIVE

## 2022-06-27 LAB — CBC
HCT: 39.1 % (ref 36.0–46.0)
Hemoglobin: 12.7 g/dL (ref 12.0–15.0)
MCH: 26.4 pg (ref 26.0–34.0)
MCHC: 32.5 g/dL (ref 30.0–36.0)
MCV: 81.3 fL (ref 80.0–100.0)
Platelets: 173 10*3/uL (ref 150–400)
RBC: 4.81 MIL/uL (ref 3.87–5.11)
RDW: 14.1 % (ref 11.5–15.5)
WBC: 9 10*3/uL (ref 4.0–10.5)
nRBC: 0 % (ref 0.0–0.2)

## 2022-06-27 LAB — RPR: RPR Ser Ql: NONREACTIVE

## 2022-06-27 LAB — WET PREP, GENITAL
Clue Cells Wet Prep HPF POC: NONE SEEN
Sperm: NONE SEEN
Trich, Wet Prep: NONE SEEN
WBC, Wet Prep HPF POC: <10 (ref <10)
Yeast Wet Prep HPF POC: NONE SEEN

## 2022-06-27 LAB — CREATININE, SERUM
Creatinine, Ser: 0.64 mg/dL (ref 0.44–1.00)
GFR, Estimated: >60 mL/min (ref >60)

## 2022-06-27 MED ORDER — SIMETHICONE 80 MG PO CHEW: 80.0000 mg | CHEWABLE_TABLET | ORAL | Status: DC | PRN

## 2022-06-27 MED ORDER — OXYTOCIN-SODIUM CHLORIDE 30-0.9 UT/500ML-% IV SOLN
2.5000 [IU]/h | INTRAVENOUS | Status: DC
  Administered 2022-06-27: 2.5 [IU]/h via INTRAVENOUS
  Filled 2022-06-27: qty 500

## 2022-06-27 MED ORDER — BENZOCAINE-MENTHOL 20-0.5 % EX AERO: 1.0000 | INHALATION_SPRAY | CUTANEOUS | Status: DC | PRN

## 2022-06-27 MED ORDER — OXYCODONE-ACETAMINOPHEN 5-325 MG PO TABS: 1.0000 | ORAL_TABLET | ORAL | Status: DC | PRN

## 2022-06-27 MED ORDER — PRENATAL MULTIVITAMIN CH
1.0000 | ORAL_TABLET | Freq: Every day | ORAL | Status: DC
  Administered 2022-06-27 – 2022-06-29 (×3): 1 via ORAL
  Filled 2022-06-27 (×3): qty 1

## 2022-06-27 MED ORDER — DIBUCAINE (PERIANAL) 1 % EX OINT: 1.0000 | TOPICAL_OINTMENT | CUTANEOUS | Status: DC | PRN

## 2022-06-27 MED ORDER — ONDANSETRON HCL 4 MG/2ML IJ SOLN: 4.0000 mg | INTRAMUSCULAR | Status: DC | PRN

## 2022-06-27 MED ORDER — OXYTOCIN BOLUS FROM INFUSION
333.0000 mL | Freq: Once | INTRAVENOUS | Status: AC
  Administered 2022-06-27: 333 mL via INTRAVENOUS

## 2022-06-27 MED ORDER — SENNOSIDES-DOCUSATE SODIUM 8.6-50 MG PO TABS
2.0000 | ORAL_TABLET | Freq: Every day | ORAL | Status: DC
  Administered 2022-06-28: 2 via ORAL
  Filled 2022-06-27 (×2): qty 2

## 2022-06-27 MED ORDER — DIPHENHYDRAMINE HCL 25 MG PO CAPS: 25.0000 mg | ORAL_CAPSULE | Freq: Four times a day (QID) | ORAL | Status: DC | PRN

## 2022-06-27 MED ORDER — OXYCODONE HCL 5 MG PO TABS: 5.0000 mg | ORAL_TABLET | ORAL | Status: DC | PRN

## 2022-06-27 MED ORDER — METHYLERGONOVINE MALEATE 0.2 MG/ML IJ SOLN
0.2000 mg | Freq: Once | INTRAMUSCULAR | Status: DC
Start: 2022-06-27 — End: 2022-06-27

## 2022-06-27 MED ORDER — COCONUT OIL OIL: 1.0000 | TOPICAL_OIL | Status: DC | PRN

## 2022-06-27 MED ORDER — LIDOCAINE HCL (PF) 1 % IJ SOLN
30.0000 mL | INTRAMUSCULAR | Status: AC | PRN
  Administered 2022-06-27: 30 mL via SUBCUTANEOUS
  Filled 2022-06-27: qty 30

## 2022-06-27 MED ORDER — ENOXAPARIN SODIUM 80 MG/0.8ML IJ SOSY
1.0000 mg/kg | PREFILLED_SYRINGE | Freq: Two times a day (BID) | INTRAMUSCULAR | Status: DC
  Administered 2022-06-27 – 2022-06-29 (×4): 75 mg via SUBCUTANEOUS
  Filled 2022-06-27 (×4): qty 0.8

## 2022-06-27 MED ORDER — ACETAMINOPHEN 500 MG PO TABS
1000.0000 mg | ORAL_TABLET | Freq: Four times a day (QID) | ORAL | Status: DC
  Administered 2022-06-27 – 2022-06-29 (×8): 1000 mg via ORAL
  Filled 2022-06-27 (×8): qty 2

## 2022-06-27 MED ORDER — SOD CITRATE-CITRIC ACID 500-334 MG/5ML PO SOLN: 30.0000 mL | ORAL | Status: DC | PRN

## 2022-06-27 MED ORDER — CEFAZOLIN SODIUM-DEXTROSE 2-4 GM/100ML-% IV SOLN
2.0000 g | Freq: Once | INTRAVENOUS | Status: AC
  Administered 2022-06-27: 2 g via INTRAVENOUS
  Filled 2022-06-27: qty 100

## 2022-06-27 MED ORDER — FENTANYL-BUPIVACAINE-NACL 0.5-0.125-0.9 MG/250ML-% EP SOLN
EPIDURAL | Status: AC
  Filled 2022-06-27: qty 250

## 2022-06-27 MED ORDER — TRANEXAMIC ACID-NACL 1000-0.7 MG/100ML-% IV SOLN
1000.0000 mg | INTRAVENOUS | Status: AC
  Administered 2022-06-27: 1000 mg via INTRAVENOUS
  Filled 2022-06-27: qty 100

## 2022-06-27 MED ORDER — METHYLERGONOVINE MALEATE 0.2 MG/ML IJ SOLN
INTRAMUSCULAR | Status: AC
  Filled 2022-06-27: qty 1

## 2022-06-27 MED ORDER — CEFAZOLIN SODIUM-DEXTROSE 1-4 GM/50ML-% IV SOLN
1.0000 g | Freq: Three times a day (TID) | INTRAVENOUS | Status: DC
  Filled 2022-06-27: qty 50

## 2022-06-27 MED ORDER — ACETAMINOPHEN 325 MG PO TABS: 650.0000 mg | ORAL_TABLET | ORAL | Status: DC | PRN

## 2022-06-27 MED ORDER — LACTATED RINGERS IV SOLN: 500.0000 mL | INTRAVENOUS | Status: DC | PRN

## 2022-06-27 MED ORDER — OXYCODONE HCL 5 MG PO TABS: 10.0000 mg | ORAL_TABLET | ORAL | Status: DC | PRN

## 2022-06-27 MED ORDER — LACTATED RINGERS IV SOLN: INTRAVENOUS | Status: DC

## 2022-06-27 MED ORDER — ONDANSETRON HCL 4 MG/2ML IJ SOLN: 4.0000 mg | Freq: Four times a day (QID) | INTRAMUSCULAR | Status: DC | PRN

## 2022-06-27 MED ORDER — IBUPROFEN 600 MG PO TABS
600.0000 mg | ORAL_TABLET | Freq: Four times a day (QID) | ORAL | Status: DC
  Filled 2022-06-27: qty 1

## 2022-06-27 MED ORDER — ONDANSETRON HCL 4 MG PO TABS: 4.0000 mg | ORAL_TABLET | ORAL | Status: DC | PRN

## 2022-06-27 MED ORDER — OXYCODONE-ACETAMINOPHEN 5-325 MG PO TABS: 2.0000 | ORAL_TABLET | ORAL | Status: DC | PRN

## 2022-06-27 MED ORDER — ACETAMINOPHEN 325 MG PO TABS
650.0000 mg | ORAL_TABLET | ORAL | Status: DC | PRN
  Filled 2022-06-27: qty 2

## 2022-06-27 MED ORDER — ENOXAPARIN SODIUM 80 MG/0.8ML IJ SOSY: 1.0000 mg/kg | PREFILLED_SYRINGE | INTRAMUSCULAR | Status: DC

## 2022-06-27 MED ORDER — WITCH HAZEL-GLYCERIN EX PADS: 1.0000 | MEDICATED_PAD | CUTANEOUS | Status: DC | PRN

## 2022-06-27 MED ORDER — TETANUS-DIPHTH-ACELL PERTUSSIS 5-2.5-18.5 LF-MCG/0.5 IM SUSY: 0.5000 mL | PREFILLED_SYRINGE | Freq: Once | INTRAMUSCULAR | Status: DC

## 2022-06-27 NOTE — Lactation Note (Signed)
This note was copied from a baby's chart. Lactation Consultation Note  Patient Name: Alicia Mendez [id number] Date: 06/27/2022 Reason for consult: L&D Initial assessment;Infant < 6lbs;Late-preterm 34-36.6wks (LC F/U - 2nd attempt. baby on the warmer, LD RN informed LC baby is on the warmer to warm up. Has not been to the breast yet. Birth parent and Support person aware LC will be seen on the floor when transferred.) Age:28 hours  Maternal Data    Feeding Mother's Current Feeding Choice: Breast Milk   Consult Status Consult Status: Follow-up from L&D Date: 06/27/22 Follow-up type: In-patient    [name] 06/27/2022, 11:03 AM

## 2022-06-27 NOTE — Lactation Note (Signed)
This note was copied from a baby's chart. Lactation Consultation Note  Patient Name: Alicia Mendez [id number] Date: 06/27/2022   Age:28 hours Birth Parent request, having soreness with pumping on left breast, LC switched breast flange to 24 mm. Birth Parent placed EBM on breast and let air dry to help with soreness. Birth Parent pumped 5 mls using DEBP and gave to infant, attempted latch but infant did not latch, infant did better with lavender Nfant nipple, infant given 5 mls of donor breast milk total feeding infant took 10 mls of EBM/Donor milk. Birth Parent will continue to follow LPTI feeding plan.  Maternal Data    Feeding    LATCH Score                    Lactation Tools Discussed/Used    Interventions    Discharge    Consult Status      Danelle Earthly 06/27/2022, 10:34 PM

## 2022-06-27 NOTE — Lactation Note (Signed)
This note was copied from a baby's chart. Lactation Consultation Note  Patient Name: Alicia Mendez [id number] Date: 06/27/2022 Reason for consult: Initial assessment;Late-preterm 34-36.6wks;Infant < 6lbs Age:28 hours Birth Parent's current feeding plan is : Breastfeeding and donor breast milk. Birth Parent attempted to latch infant on her left breast using the cross cradle hold, infant only lick and tasted then fell asleep ather few minutes. Birth Parent expressed 8 mls of colostrum that was initially  given using a extra slow flow bottle nipple, infant has un-coordinate suck and struggle take 3 mls , the rest of EBM was given slowly  by spoon feeding, infant would benefit from SLP. LC gave RN a Purple Nfant nipple to see if infant would feed better for next feeding. LC explained this is normal for LPTI, LC discussed LPTI feeding policy, Limited total feedings at breast and with supplementation to 30 minutes or less, do not make infant wait to BF, BF 8x with 24 hours, STS. If infant doesn't latch after 5 minutes to stop attempting to latch LPTI and supplement infant 1st with any EBM and then donor breast milk. Birth Parent re-fitted with 27 mm breast flange which was more comfortable fit and was using DEBP as LC left the room, Birth Parent knows to pump every 3 hours for 15 minutes on initial setting. Birth Parent feeding plan: 1- Continue to follow LPTI feeding policy green sheet given. 2- If infant doesn't latch after 5 minutes, stop and supplement infant with any EBM 1st and then donor breast milk. 3- Continue to use DEBP every 3 hours for 15 minutes on initial setting and give back EBM first and then donor breast milk.  4- Continue to ask RN/LC for latch assistance if needed. Maternal Data Has patient been taught Hand Expression?: Yes Does the patient have breastfeeding experience prior to this delivery?: Yes How long did the patient breastfeed?: Per Birth Parent, did not BF 1st  child,  2nd child pumped only for 6 months, 3rd child pumped only for 1 month, 4 th child BF for 3 months, 3rd child is now [age] years old.  Feeding Mother's Current Feeding Choice: Breast Milk and Donor Milk Nipple Type: Extra Slow Flow  LATCH Score Latch: Too sleepy or reluctant, no latch achieved, no sucking elicited.  Audible Swallowing: None  Type of Nipple: Everted at rest and after stimulation  Comfort (Breast/Nipple): Soft / non-tender  Hold (Positioning): Assistance needed to correctly position infant at breast and maintain latch.  LATCH Score: 5   Lactation Tools Discussed/Used    Interventions Interventions: Breast feeding basics reviewed;Assisted with latch;Skin to skin;Breast compression;Adjust position;Support pillows;Position options;Expressed milk;DEBP;Education;Pace feeding;LC Services brochure;LPT handout/interventions  Discharge Pump: Personal (Per Birth parent she has Mom Cozy DEBP)  Consult Status Consult Status: Follow-up Date: 06/28/22 Follow-up type: In-patient    Danelle Earthly 06/27/2022, 6:43 PM

## 2022-06-27 NOTE — Lactation Note (Signed)
This note was copied from a baby's chart. Lactation Consultation Note  Patient Name: Alicia Mendez Date: 06/27/2022 Reason for consult: L&D Initial assessment;Late-preterm 34-36.6wks (LC was informed by the [employer] Arbour, baby was grunty and to give him some time. LC mentioned she would F/U.) Age:28 mins  Late entry.      Consult Status Consult Status: Follow-up Date: 06/27/22 Follow-up type: In-patient    Alicia Mendez 06/27/2022, 10:53 AM

## 2022-06-27 NOTE — MAU Provider Note (Signed)
History     CSN: [id number]  Arrival date and time: 06/27/22 0213   None     Chief Complaint  Patient presents with   Vaginal Bleeding   HPI Alicia Mendez is a 26 y.o. [id number] at [redacted]w[redacted]d who presents to MAU via EMS with reports of vaginal bleeding. Patient reports she woke up feeling some abdominal cramping and as if her underwear were wet. She got up and went to the bathroom and noticed when she wiped there was dark red blood on the tissue. Several minutes later she went back to the bathroom and passed a large, dark red "blob" the size of her palm. She has not had any more since, minus a few pink streaks of blood on her pad. No recent intercourse or cervical exams. She reports feeling a cramp 3 times since she has been to MAU. She denies leaking fluid. Endorses normal active fetal movement.   Patient receives [facility]. Pregnancy has been significant for FGR, oligohydramnios, antiphospholipid syndrome and hx of PE which has been taking Lovenox for. Of note, she reports she missed her MFM appointment yesterday as she checked in around 1:45p and was still waiting to be seen after about 45 minutes and had to leave.    OB History     Gravida  9   Para  4   Term  2   Preterm  2   AB  4   Living  4      SAB  4   IAB      Ectopic      Multiple      Live Births  4           Past Medical History:  Diagnosis Date   Acute cholecystitis due to biliary calculus 12/16/2020   Anticardiolipin antibody positive    Antiphospholipid antibody syndrome (HCC)    Anxiety    Asthma    Clotting disorder (HCC)    Gallstones    Hyponatremia 12/16/2020   Pulmonary embolism [facility])     Past Surgical History:  Procedure Laterality Date   CHOLECYSTECTOMY N/A 12/16/2020   Procedure: LAPAROSCOPIC CHOLECYSTECTOMY;  Surgeon: Kinsinger, De Blanch, MD;  Location: [facility] OR;  Service: General;  Laterality: N/A;   WISDOM TOOTH EXTRACTION Bilateral     Family History  Problem  Relation Age of Onset   Anemia Mother    Asthma Paternal Aunt    Hypertension Paternal Aunt    Hypertension Paternal Uncle    Heart disease Maternal Grandfather    Hypertension Maternal Grandfather    Stroke Maternal Grandfather    Hypertension Paternal Grandmother    Hypertension Paternal Grandfather    Diabetes Paternal Grandfather    Stroke Paternal Grandfather    Cancer Other    Birth defects Neg Hx     Social History   Tobacco Use   Smoking status: Never   Smokeless tobacco: Never  Vaping Use   Vaping Use: Never used  Substance Use Topics   Alcohol use: Not Currently    Comment: not while preg   Drug use: Never    Allergies:  Allergies  Allergen Reactions   Penicillins Nausea And Vomiting and Other (See Comments)    Patient stated that she experienced severe sweating. Patient stated that she experienced severe sweating.    Medications Prior to Admission  Medication Sig Dispense Refill Last Dose   enoxaparin (LOVENOX) 100 MG/ML injection Inject 100 mg into the skin in the morning and  at bedtime.   06/26/2022 at 2100   Prenatal Vit-Fe Fumarate-FA (MULTIVITAMIN-PRENATAL) 27-0.8 MG TABS tablet Take 1 tablet by mouth daily at 12 noon.   Past Month    Review of Systems  Constitutional: Negative.   Respiratory: Negative.    Cardiovascular: Negative.   Gastrointestinal:  Positive for abdominal pain (cramp).  Genitourinary:  Positive for vaginal bleeding. Negative for dysuria and vaginal discharge.  Musculoskeletal: Negative.    Physical Exam   Blood pressure 123/87, pulse 73, temperature 98 F (36.7 C), height 5\' 3"  (1.6 m), weight 75.8 kg, last menstrual period 10/04/2021.  Physical Exam Vitals and nursing note reviewed. Exam conducted with a chaperone present.  Constitutional:      General: She is not in acute distress. Eyes:     Extraocular Movements: Extraocular movements intact.     Pupils: Pupils are equal, round, and reactive to light.  Cardiovascular:      Rate and Rhythm: Normal rate.  Pulmonary:     Effort: Pulmonary effort is normal.  Abdominal:     Palpations: Abdomen is soft.     Tenderness: There is no abdominal tenderness.     Comments: Gravid   Genitourinary:    Comments: Normal external female genitalia, vaginal walls pink with rugae, small amount of dark red blood, no active bleeding, no pooling of amniotic fluid, cervix visually open without lesions/masses Musculoskeletal:        General: Normal range of motion.     Cervical back: Normal range of motion.  Skin:    General: Skin is warm and dry.  Neurological:     General: No focal deficit present.     Mental Status: She is alert and oriented to person, place, and time.  Psychiatric:        Mood and Affect: Mood normal.        Behavior: Behavior normal.        Judgment: Judgment normal.   Dilation: 4 Effacement (%): 50 Station: Ballotable Presentation: Vertex Exam by:: Sharlet Notaro, CNM  NST FHR: 120 bpm, moderate variability, +15x15 accels, no decels Toco: Q6      MAU Course  Procedures  MDM Speculum exam shows small amount of dark red blood. Cervix 4cm. Patient contracting Q6 mins, however unaware of them. Given that patient missed BPP and doppler appointment yesterday, those were ordered as well as limited ultrasound to look at placenta. Preliminary ultrasound report shows BPP 0/8, AFI at 1.7cm. SD%tile noted to be 87%. No evidence of abruption. NST is reactive and reassuring. Dr. [phone] notified and will admit patient to L&D for delivery. L&D team notified.   Assessment and Plan  [redacted] weeks gestation of pregnancy Vaginal bleeding  - Admit to L&D. Orders placed - L&D team notified of patient admission   Alysia Penna, CNM 06/27/2022, 4:59 AM

## 2022-06-27 NOTE — Discharge Summary (Shared)
Postpartum Discharge Summary  Date of Service updated***     Patient Name: Alicia Mendez DOB: June 04, 1996 MRN: [id number]  Date of admission: 06/27/2022 Delivery date:06/27/2022  Delivering provider: Gaylan Gerold R  Date of discharge: 06/27/2022  Admitting diagnosis: Indication for care in labor or delivery [O75.9] Intrauterine pregnancy: [redacted]w[redacted]d     Secondary diagnosis:  Principal Problem:   Indication for care in labor or delivery  Additional problems: none    Discharge diagnosis: Term Pregnancy Delivered and antiphospholipid syndrome, on lovenox                                               Postpartum procedures:{Postpartum procedures:23558} Augmentation: AROM Complications: None  Hospital course: Onset of Labor With Vaginal Delivery      26 y.o. yo [id number] at [redacted]w[redacted]d was admitted in Active Labor on 06/27/2022. Patient had an uncomplicated labor course as follows:  Membrane Rupture Time/Date: 9:23 AM ,06/27/2022   Delivery Method:Vaginal, Spontaneous  Episiotomy: None  Lacerations:  1st degree  Patient had an uncomplicated postpartum course.  She is ambulating, tolerating a regular diet, passing flatus, and urinating well. Patient is discharged home in stable condition on 06/27/22.  Newborn Data: Birth date:06/27/2022  Birth time:9:29 AM  Gender:Female  Living status:Living  Apgars:8 ,9  Weight:4 lb 13 oz (2.183 kg)   Magnesium Sulfate received: No BMZ received: No Rhophylac:N/A MMR:N/A T-DaP:Given prenatally Flu: N/A Transfusion:No  Physical exam  Vitals:   06/27/22 0230 06/27/22 0357 06/27/22 0718 06/27/22 1000  BP: 127/76 123/87 (!) 107/58 99/76  Pulse: 73 73 67 86  Resp:   17   Temp: 98 F (36.7 C)  98.2 F (36.8 C) 98.1 F (36.7 C)  TempSrc:   Oral Oral  SpO2:   99%   Weight: 167 lb (75.8 kg)     Height: $Remove'5\' 3"'OxYEyRj$  (1.6 m)      General: {Exam; general:21111117} Lochia: {Desc; appropriate/inappropriate:30686::"appropriate"} Uterine Fundus: {Desc;  firm/soft:30687} Incision: {Exam; incision:21111123} DVT Evaluation: {Exam; dvt:2111122} Labs: Lab Results  Component Value Date   WBC 9.0 06/27/2022   HGB 12.7 06/27/2022   HCT 39.1 06/27/2022   MCV 81.3 06/27/2022   PLT 173 06/27/2022      Latest Ref Rng & Units 06/02/2022    8:09 AM  CMP  Glucose 70 - 99 mg/dL 94   BUN 6 - 20 mg/dL 10   Creatinine 0.57 - 1.00 mg/dL 0.65   Sodium 134 - 144 mmol/L 134   Potassium 3.5 - 5.2 mmol/L 3.8   Chloride 96 - 106 mmol/L 99   CO2 20 - 29 mmol/L 16   Calcium 8.7 - 10.2 mg/dL 8.9   Total Protein 6.0 - 8.5 g/dL 6.6   Total Bilirubin 0.0 - 1.2 mg/dL <0.2   Alkaline Phos 44 - 121 IU/L 123   AST 0 - 40 IU/L 11   ALT 0 - 32 IU/L 12    Edinburgh Score:     No data to display           After visit meds:  Allergies as of 06/27/2022       Reactions   Penicillins Nausea And Vomiting, Other (See Comments)   Patient stated that she experienced severe sweating. Patient stated that she experienced severe sweating.     Med Rec must be completed prior to using this [facility]***  Discharge home in stable condition Infant Feeding: {Baby feeding:23562} Infant Disposition:{CHL IP OB HOME WITH PJKDTO:67124} Discharge instruction: per After Visit Summary and Postpartum booklet. Activity: Advance as tolerated. Pelvic rest for 6 weeks.  Diet: {OB PYKD:98338250}  Future Appointments: Future Appointments  Date Time Provider [place]  07/02/2022  9:55 AM Truett Mainland, DO [facility] None  07/03/2022  1:15 PM [facility] NURSE [facility]  07/03/2022  1:30 PM [facility] US2 [facility]  07/10/2022 10:55 AM Truett Mainland, DO [facility] None  07/10/2022 12:30 PM [facility] NURSE [facility]  07/10/2022 12:45 PM [facility] US4 [facility]  07/17/2022 10:35 AM Stinson, Tanna Savoy, DO [facility] None   Follow up Visit: Message sent to [facility] by Candie Chroman, CNM on 06/27/22  Please schedule this patient for a In person postpartum visit in 6 weeks  with the following provider: Any provider. Additional Postpartum F/U: None   High risk pregnancy complicated by: Bleeding disorder on Lovenox Delivery mode:  Vaginal, Spontaneous  Anticipated Birth Control:  Unsure   06/27/2022 Gabriel Carina, CNM

## 2022-06-27 NOTE — Lactation Note (Signed)
This note was copied from a baby's chart. Lactation Consultation Note  Patient Name: Boy Cheril Slattery QJJHE'R Date: 06/27/2022 Reason for consult: L&D Initial assessment;RN request;Late-preterm 34-36.6wks;Infant < 6lbs;Breastfeeding assistance (3rd visit, Baby STS and latched on the Lt.Br, wide open mouth, and w / breast compressions swallows, fed 10 mins. released and switched to the Rt Br. football, latched, more sluggish, swallows and stimulation.)  Age:25 hours Football position worked better for the baby, swallows noted and still feeding. Increased Latch score 8  Maternal Data Does the patient have breastfeeding experience prior to this delivery?: Yes How long did the patient breastfeed?: P5 , per Birth parent - the 4 th baby was small like this one, few months for all 4 and pumped.  Feeding Mother's Current Feeding Choice: Breast Milk  LATCH Score Latch:  (LC adjusted the position to increase depth.)  Audible Swallowing:  (swallows increased w/ breast compressions)  Type of Nipple:  (nipple well rounded when baby released)  Comfort (Breast/Nipple):  (per birth parent comfortable at the breast, and cramping)  Hold (Positioning):  (mom initially latched the baby)      Lactation Tools Discussed/Used    Interventions Interventions: Breast feeding basics reviewed;Skin to skin;Adjust position;Education  Discharge    Consult Status Consult Status: Follow-up from L&D Date: 06/27/22 Follow-up type: In-patient    Matilde Sprang Lora Chavers 06/27/2022, 11:39 AM

## 2022-06-27 NOTE — H&P (Signed)
OBSTETRIC ADMISSION HISTORY AND PHYSICAL  Alicia Mendez is a 26 y.o. female [phone] with IUP at [redacted]w[redacted]d by 7 week ultrasound presenting forvaginal bleeding. Patient reports she woke up feeling some abdominal cramping and as if her underwear were wet. She got up and went to the bathroom and noticed when she wiped there was dark red blood on the tissue. Several minutes later she went back to the bathroom and passed a large, dark red "blob" the size of her palm. She has not had any more since, minus a few pink streaks of blood on her pad. No recent intercourse or cervical exams. She reports feeling a cramp 3 times since she has been to [facility]. She denies leaking fluid. Endorses normal active fetal movement. She plans on breast feeding. She is undecided for birth control. She received her prenatal care at  [facility]    Dating: By 7 week ultrasound --->  Estimated Date of Delivery: 07/26/22  Sono:    @[redacted]w[redacted]d , CWD, normal anatomy, breech presentation, posterior placenta, 287g, 82% EFW  @[redacted]w[redacted]d , CWD, normal anatomy, cephalic presentation, posterior placenta, 2117g, 10% EFW  Prenatal History/Complications:  - Antiphospholipid antibody syndrome - Fetal growth restriction - Oligohydramnios - Hx of pulmonary embolus   Past Medical History: Past Medical History:  Diagnosis Date   Acute cholecystitis due to biliary calculus 12/16/2020   Anticardiolipin antibody positive    Antiphospholipid antibody syndrome (HCC)    Anxiety    Asthma    Clotting disorder (HCC)    Gallstones    Hyponatremia 12/16/2020   Pulmonary embolism (HCC)     Past Surgical History: Past Surgical History:  Procedure Laterality Date   CHOLECYSTECTOMY N/A 12/16/2020   Procedure: LAPAROSCOPIC CHOLECYSTECTOMY;  Surgeon: Kinsinger, 12/18/2020, MD;  Location: [facility];  Service: General;  Laterality: N/A;   WISDOM TOOTH EXTRACTION Bilateral     Obstetrical History: OB History     Gravida  9   Para  4   Term  2   Preterm  2    AB  4   Living  4      SAB  4   IAB      Ectopic      Multiple      Live Births  4           Social History Social History   Socioeconomic History   Marital status: Single    Spouse name: Not on file   Number of children: Not on file   Years of education: Not on file   Highest education level: Not on file  Occupational History   Not on file  Tobacco Use   Smoking status: Never   Smokeless tobacco: Never  Vaping Use   Vaping Use: Never used  Substance and Sexual Activity   Alcohol use: Not Currently    Comment: not while preg   Drug use: Never   Sexual activity: Yes  Other Topics Concern   Not on file  Social History Narrative   Not on file   Social Determinants of Health   Financial Resource Strain: Not on file  Food Insecurity: Not on file  Transportation Needs: Not on file  Physical Activity: Not on file  Stress: Not on file  Social Connections: Not on file    Family History: Family History  Problem Relation Age of Onset   Anemia Mother    Asthma Paternal Aunt    Hypertension Paternal Aunt    Hypertension Paternal Uncle  Heart disease Maternal Grandfather    Hypertension Maternal Grandfather    Stroke Maternal Grandfather    Hypertension Paternal Grandmother    Hypertension Paternal Grandfather    Diabetes Paternal Grandfather    Stroke Paternal Grandfather    Cancer Other    Birth defects Neg Hx     Allergies: Allergies  Allergen Reactions   Penicillins Nausea And Vomiting and Other (See Comments)    Patient stated that she experienced severe sweating. Patient stated that she experienced severe sweating.    Medications Prior to Admission  Medication Sig Dispense Refill Last Dose   enoxaparin (LOVENOX) 100 MG/ML injection Inject 100 mg into the skin in the morning and at bedtime.   06/26/2022 at 2100   Prenatal Vit-Fe Fumarate-FA (MULTIVITAMIN-PRENATAL) 27-0.8 MG TABS tablet Take 1 tablet by mouth daily at 12 noon.   Past  Month   Review of Systems   All systems reviewed and negative except as stated in HPI  Blood pressure (!) 107/58, pulse 67, temperature 98.2 F (36.8 C), temperature source Oral, height 5\' 3"  (1.6 m), weight 75.8 kg, last menstrual period 10/04/2021. General appearance: alert and no distress Lungs: effort normal Heart: regular rate Abdomen: soft, non-tender; gravid Extremities: no sign of DVT Presentation: cephalic Fetal monitoring: 120 bpm, moderate variability, +15x15 accels, no decels Uterine activity: Q6 mins Dilation: 8 Effacement (%): 80 Station: -1 Exam by:: Dr. Trina Ao  Prenatal labs: ABO, Rh: --/--/AB POS (09/02 0409) Antibody: NEG (09/02 0409) Rubella: 20.40 (03/10 1011) RPR: Non Reactive (07/06 0846)  HBsAg: Negative (03/10 1011)  HIV: Non Reactive (07/06 0846)  GBS:    2 hr Glucola: 70/100/84 Genetic screening  LR NIPS Anatomy US: normal  Prenatal Transfer Tool  Maternal Diabetes: No Genetic Screening: Normal Maternal Ultrasounds/Referrals: IUGR Fetal Ultrasounds or other Referrals:  None Maternal Substance Abuse:  No Significant Maternal Medications:  Meds include: Other: Lovenox Significant Maternal Lab Results: Other: GBS unknown  Results for orders placed or performed during the hospital encounter of 06/27/22 (from the past 24 hour(s))  CBC   Collection Time: 06/27/22  4:09 AM  Result Value Ref Range   WBC 9.0 4.0 - 10.5 K/uL   RBC 4.81 3.87 - 5.11 MIL/uL   Hemoglobin 12.7 12.0 - 15.0 g/dL   HCT 39.1 36.0 - 46.0 %   MCV 81.3 80.0 - 100.0 fL   MCH 26.4 26.0 - 34.0 pg   MCHC 32.5 30.0 - 36.0 g/dL   RDW 14.1 11.5 - 15.5 %   Platelets 173 150 - 400 K/uL   nRBC 0.0 0.0 - 0.2 %  Type and screen [place]   Collection Time: 06/27/22  4:09 AM  Result Value Ref Range   ABO/RH(D) AB POS    Antibody Screen NEG    Sample Expiration      06/30/2022,2359 Performed at [facility], [place] [address] [place], [place] [address]    Wet prep, genital   Collection Time: 06/27/22  4:20 AM  Result Value Ref Range   Yeast Wet Prep HPF POC NONE SEEN NONE SEEN   Trich, Wet Prep NONE SEEN NONE SEEN   Clue Cells Wet Prep HPF POC NONE SEEN NONE SEEN   WBC, Wet Prep HPF POC <10 <10   Sperm NONE SEEN     Patient Active Problem List   Diagnosis Date Noted   Indication for care in labor or delivery 06/27/2022   Antiphospholipid antibody syndrome complicating pregnancy ([place]) 05/14/2022  Supervision of high risk pregnancy in third trimester 01/02/2022   Previous preterm delivery, antepartum 01/02/2022   History of prior pregnancy with SGA newborn 01/02/2022   History of pulmonary embolism 12/16/2020   History of DVT (deep vein thrombosis) 12/16/2020   Headache disorder 07/13/2019   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 [id number] at [redacted]w[redacted]d here for vaginal bleeding and BPP 0/8.   #Labor: Patient rapidly progressed from 4 to 8 cm #Pain: prn #FWB: Cat 1 tracing #ID: GBS unknown > ancef #MOF: breast #MOC: undecided #Circ: ask in postpartum    Brand Males, CNM  06/27/2022, 7:57 AM

## 2022-06-27 NOTE — MAU Note (Signed)
Pt says - has arrived via EMS She woke at 0111- noticed shorts moist- went to b-room At 0120- noticed red blood when she wiped  At 0125- called nurse - went to B-room- felt something come out of vag- saw blood clot - size of palm  On arrival  smear on pad Felt  cramps2x  -lower abd -  Now- no pain

## 2022-06-28 NOTE — Progress Notes (Signed)
Post Partum Day #1 Subjective: no complaints, up ad lib, and tolerating PO; now bottlefeeding; considering IUD for contraception; declines circumcision for her son; Lovenox started last PM  Objective: Blood pressure 107/72, pulse 67, temperature 98 F (36.7 C), temperature source Oral, resp. rate 16, height 5\' 3"  (1.6 m), weight 75.8 kg, last menstrual period 10/04/2021, SpO2 99 %, unknown if currently breastfeeding.  Physical Exam:  General: alert, cooperative, and no distress Lochia: appropriate Uterine Fundus: firm DVT Evaluation: No evidence of DVT seen on physical exam.  Recent Labs    06/27/22 0409  HGB 12.7  HCT 39.1    Assessment/Plan: Plan for discharge tomorrow as infant was preterm Continue Lovenox 75mg  q 12h   LOS: 1 day   08/27/22, CNM 06/28/2022, 9:02 AM

## 2022-06-28 NOTE — Lactation Note (Signed)
This note was copied from a baby's chart. Lactation Consultation Note  Patient Name: Alicia Mendez [id number] Date: 06/28/2022 Reason for consult: Follow-up assessment Age:28 hours  Mother states she decided this morning to exclusively formula feed.  Mother states she will pump once home. Encouraged her to call if we can assist with pumping here in the hospital.  She states her nipples are cracked and will get coconut oil from home.   Feeding Mother's Current Feeding Choice: Formula Nipple Type: Extra Slow Flow    Lactation Tools Discussed/Used Tools: Pump  Discharge Pump: Personal (Mom Cozi)  Consult Status Consult Status: PRN    Hardie Pulley 06/28/2022, 5:00 PM

## 2022-06-29 ENCOUNTER — Other Ambulatory Visit (HOSPITAL_COMMUNITY): Payer: Self-pay

## 2022-06-29 LAB — CULTURE, BETA STREP (GROUP B ONLY)

## 2022-06-29 MED ORDER — ENOXAPARIN SODIUM 80 MG/0.8ML IJ SOSY
1.0000 mg/kg | PREFILLED_SYRINGE | Freq: Two times a day (BID) | INTRAMUSCULAR | 0 refills | Status: DC
  Filled 2022-06-29: qty 45, 30d supply, fill #0

## 2022-06-29 MED ORDER — ACETAMINOPHEN 500 MG PO TABS
1000.0000 mg | ORAL_TABLET | Freq: Four times a day (QID) | ORAL | 0 refills | Status: AC
Start: 2022-06-29 — End: ?
  Filled 2022-06-29: qty 30, 4d supply, fill #0

## 2022-06-29 MED ORDER — ENOXAPARIN SODIUM 80 MG/0.8ML IJ SOSY: 1.0000 mg/kg | PREFILLED_SYRINGE | Freq: Two times a day (BID) | INTRAMUSCULAR | 0 refills | Status: AC

## 2022-06-30 ENCOUNTER — Other Ambulatory Visit (HOSPITAL_COMMUNITY): Payer: Self-pay

## 2022-06-30 LAB — GC/CHLAMYDIA PROBE AMP (~~LOC~~) NOT AT ARMC
Chlamydia: NEGATIVE
Comment: NEGATIVE
Comment: NORMAL
Neisseria Gonorrhea: NEGATIVE

## 2022-07-01 ENCOUNTER — Encounter: Payer: Self-pay | Admitting: General Practice

## 2022-07-01 ENCOUNTER — Other Ambulatory Visit: Payer: Self-pay

## 2022-07-02 ENCOUNTER — Encounter: Payer: Medicaid Other | Admitting: Family Medicine

## 2022-07-03 ENCOUNTER — Ambulatory Visit: Payer: Medicaid Other

## 2022-07-09 ENCOUNTER — Telehealth (HOSPITAL_COMMUNITY): Payer: Self-pay | Admitting: *Deleted

## 2022-07-09 NOTE — Telephone Encounter (Signed)
Mom reports feeling good. One breast is very sore, no fever or redness per mom. Encouraged her to massage breast, use warm heat pack, and continue nursing. If no better or gets worse, or if she has a fever, discussed calling OB office for guidance. Gave LC appt number as well. EPDS=2 [facility] score=0) Mom reports baby is doing well. Feeding, peeing, and pooping without difficulty. Safe sleep reviewed. Mom reports no concerns about baby at present.  Duffy Rhody, RN 07-09-2022 at 10:39am

## 2022-07-10 ENCOUNTER — Ambulatory Visit: Payer: Medicaid Other

## 2022-07-10 ENCOUNTER — Encounter: Payer: Medicaid Other | Admitting: Family Medicine

## 2022-07-17 ENCOUNTER — Encounter: Payer: Medicaid Other | Admitting: Family Medicine

## 2022-07-26 DIAGNOSIS — Z419 Encounter for procedure for purposes other than remedying health state, unspecified: Secondary | ICD-10-CM | POA: Diagnosis not present

## 2022-08-06 ENCOUNTER — Ambulatory Visit: Payer: Medicaid Other | Admitting: Family Medicine

## 2022-08-14 ENCOUNTER — Ambulatory Visit (INDEPENDENT_AMBULATORY_CARE_PROVIDER_SITE_OTHER): Payer: Medicaid Other | Admitting: Family Medicine

## 2022-08-14 DIAGNOSIS — D6861 Antiphospholipid syndrome: Secondary | ICD-10-CM | POA: Diagnosis not present

## 2022-08-14 DIAGNOSIS — Z30430 Encounter for insertion of intrauterine contraceptive device: Secondary | ICD-10-CM | POA: Diagnosis not present

## 2022-08-14 DIAGNOSIS — D500 Iron deficiency anemia secondary to blood loss (chronic): Secondary | ICD-10-CM | POA: Diagnosis not present

## 2022-08-14 DIAGNOSIS — I2699 Other pulmonary embolism without acute cor pulmonale: Secondary | ICD-10-CM | POA: Diagnosis not present

## 2022-08-14 LAB — POCT URINE PREGNANCY: Preg Test, Ur: NEGATIVE

## 2022-08-14 MED ORDER — LEVONORGESTREL 20.1 MCG/DAY IU IUD
1.0000 | INTRAUTERINE_SYSTEM | Freq: Once | INTRAUTERINE | Status: AC
  Administered 2022-08-14: 1 via INTRAUTERINE

## 2022-08-14 NOTE — Progress Notes (Signed)
IUD Procedure Note Patient identified, informed consent performed, signed copy in chart, time out was performed.  Urine pregnancy test negative.  Speculum placed in the vagina.  Cervix visualized.  Cleaned with Betadine x 2.  Paracervical block placed with Lidocaine 2% with epinephrine 10mL spread between the 12 o'clock, 4 o'clock, 8 o'clock positions. Cervix grasped anteriorly with a single tooth tenaculum.  Uterus sounded to 8 cm.  Liletta  IUD placed per manufacturer's recommendations.  Strings trimmed to 3 cm. Tenaculum was removed, good hemostasis noted.  Patient tolerated procedure well.   Patient given post procedure instructions and Liletta care card with expiration date.  Patient is asked to check IUD strings periodically and follow up in 4-6 weeks for IUD check. 

## 2022-08-14 NOTE — Progress Notes (Signed)
Post Partum Visit Note  Alicia Mendez is a 26 y.o. [id number] female who presents for a postpartum visit. She is 7 weeks postpartum following a normal spontaneous vaginal delivery.  I have fully reviewed the prenatal and intrapartum course. The delivery was at 35.6  gestational weeks.  Anesthesia: none. Postpartum course has been uneventful. Baby is doing well. Baby is feeding by bottle -   . Bleeding no bleeding. Bowel function is normal. Bladder function is  experiencing incontinence . Patient is not sexually active. Contraception method is IUD. Postpartum depression screening: negative. score6    Patient attempts to breast-feed, but was experiencing dysphoric milk injection reflex. Stopped breast feeding after a week or so. Mentally, in a much better place that 1-2 weeks after delivery.  The pregnancy intention screening data noted above was reviewed. Potential methods of contraception were discussed. The patient elected to proceed with No data recorded.   Edinburgh Postnatal Depression Scale - 08/14/22 0900       Edinburgh Postnatal Depression Scale:  In the Past 7 Days   I have been able to laugh and see the funny side of things. 0    I have looked forward with enjoyment to things. 0    I have blamed myself unnecessarily when things went wrong. 3    I have been anxious or worried for no good reason. 2    I have felt scared or panicky for no good reason. 1    Things have been getting on top of me. 0    I have been so unhappy that I have had difficulty sleeping. 0    I have felt sad or miserable. 0    I have been so unhappy that I have been crying. 0    The thought of harming myself has occurred to me. 0    Edinburgh Postnatal Depression Scale Total 6             Health Maintenance Due  Topic Date Due   COVID-19 Vaccine (1) Never done   PAP-Cervical Cytology Screening  Never done   PAP SMEAR-Modifier  Never done   INFLUENZA VACCINE  05/26/2022    The following  portions of the patient's history were reviewed and updated as appropriate: allergies, current medications, past family history, past medical history, past social history, past surgical history, and problem list.  Review of Systems Pertinent items are noted in HPI.  Objective:  BP 112/77   Pulse 67   Ht 5\' 3"  (1.6 m)   Wt 153 lb (69.4 kg)   LMP 10/04/2021 (Exact Date)   BMI 27.10 kg/m    General:  alert, cooperative, and no distress   Breasts:  not indicated  Lungs: clear to auscultation bilaterally  Heart:  regular rate and rhythm, S1, S2 normal, no murmur, click, rub or gallop  Abdomen: soft, non-tender; bowel sounds normal; no masses,  no organomegaly   Wound N/a  GU exam:  not indicated       Assessment:    1. Postpartum care and examination  Plan:   Essential components of care per ACOG recommendations:  1.  Mood and well being: Patient with negative depression screening today. Reviewed local resources for support.  - Patient tobacco use? No.   - hx of drug use? No.    2. Infant care and feeding:  -Patient currently breastmilk feeding? No.  -Social determinants of health (SDOH) reviewed in EPIC. No concerns  3. Sexuality, contraception and birth  spacing - Patient does not want a pregnancy in the next year.   - Reviewed reproductive life planning. Reviewed contraceptive methods based on pt preferences and effectiveness.  Patient desired IUD or IUS today.   - Discussed birth spacing of 18 months  4. Sleep and fatigue -Encouraged family/partner/community support of 4 hrs of uninterrupted sleep to help with mood and fatigue  5. Physical Recovery  - Discussed patients delivery and complications. She describes her labor as mixed. - Patient had a Vaginal, no problems at delivery. Patient had a 1st degree laceration. Perineal healing reviewed. Patient expressed understanding - Patient has urinary incontinence? No. - Patient is safe to resume physical and sexual  activity  6.  Health Maintenance - HM due items addressed Yes - Last pap smear No results found for: "DIAGPAP" Pap smear not done at today's visit.  -Breast Cancer screening indicated? No.   7. Chronic Disease/Pregnancy Condition follow up: None Sees hematology this afternoon. - PCP follow up  [place] for [place]

## 2022-08-26 DIAGNOSIS — Z419 Encounter for procedure for purposes other than remedying health state, unspecified: Secondary | ICD-10-CM | POA: Diagnosis not present

## 2022-09-25 DIAGNOSIS — Z419 Encounter for procedure for purposes other than remedying health state, unspecified: Secondary | ICD-10-CM | POA: Diagnosis not present

## 2022-09-30 ENCOUNTER — Ambulatory Visit: Payer: Medicaid Other | Admitting: Family Medicine

## 2022-10-26 DIAGNOSIS — Z419 Encounter for procedure for purposes other than remedying health state, unspecified: Secondary | ICD-10-CM | POA: Diagnosis not present

## 2022-11-26 DIAGNOSIS — Z419 Encounter for procedure for purposes other than remedying health state, unspecified: Secondary | ICD-10-CM | POA: Diagnosis not present

## 2022-12-25 DIAGNOSIS — Z419 Encounter for procedure for purposes other than remedying health state, unspecified: Secondary | ICD-10-CM | POA: Diagnosis not present

## 2023-01-25 DIAGNOSIS — Z419 Encounter for procedure for purposes other than remedying health state, unspecified: Secondary | ICD-10-CM | POA: Diagnosis not present

## 2023-02-19 ENCOUNTER — Telehealth: Payer: Self-pay

## 2023-02-19 NOTE — Telephone Encounter (Signed)
LVM for patient to call back. AS, CMA 

## 2023-02-20 ENCOUNTER — Emergency Department (HOSPITAL_COMMUNITY)
Admission: EM | Admit: 2023-02-20 | Discharge: 2023-02-21 | Disposition: A | Discharge status: Home / Self Care | Payer: Medicaid Other | Attending: Emergency Medicine | Admitting: Emergency Medicine

## 2023-02-20 DIAGNOSIS — J45909 Unspecified asthma, uncomplicated: Secondary | ICD-10-CM | POA: Diagnosis not present

## 2023-02-20 DIAGNOSIS — R112 Nausea with vomiting, unspecified: Secondary | ICD-10-CM | POA: Insufficient documentation

## 2023-02-20 DIAGNOSIS — G43009 Migraine without aura, not intractable, without status migrainosus: Secondary | ICD-10-CM | POA: Insufficient documentation

## 2023-02-20 DIAGNOSIS — R519 Headache, unspecified: Secondary | ICD-10-CM | POA: Diagnosis present

## 2023-02-20 DIAGNOSIS — D689 Coagulation defect, unspecified: Secondary | ICD-10-CM

## 2023-02-20 LAB — POC URINE PREG, ED: Preg Test, Ur: NEGATIVE

## 2023-02-20 LAB — URINALYSIS, ROUTINE W REFLEX MICROSCOPIC
Bilirubin Urine: NEGATIVE
Glucose, UA: NEGATIVE mg/dL
Hgb urine dipstick: NEGATIVE
Ketones, ur: 5 mg/dL — AB
Leukocytes,Ua: NEGATIVE
Nitrite: NEGATIVE
Protein, ur: NEGATIVE mg/dL
Specific Gravity, Urine: 1.026 (ref 1.005–1.030)
pH: 6 (ref 5.0–8.0)

## 2023-02-20 NOTE — ED Triage Notes (Signed)
Pt woke up with headache worsening throughout day. Vomited once. Light sensitivity. Has hx of headaches that usually go away with tylenol and a nap. Did not work today. Neuro intact

## 2023-02-21 ENCOUNTER — Encounter (HOSPITAL_COMMUNITY): Payer: Self-pay

## 2023-02-21 ENCOUNTER — Other Ambulatory Visit: Payer: Self-pay

## 2023-02-21 NOTE — ED Provider Notes (Signed)
[facility] Emergency Department Provider Note MRN:  [id number]  Arrival date & time: 02/21/23     Chief Complaint   Migraine   History of Present Illness   Alicia Mendez is a 27 y.o. year-old female with a history of antiphospholipid syndrome presenting to the ED with chief complaint of migraine.  Woke up with a dull headache, got worse at work, became really bad associated with nausea, worse with bright lights and loud noises.  Had 2 episodes of vomiting, came here for evaluation.  She has this headache about once a week.  Review of Systems  A thorough review of systems was obtained and all systems are negative except as noted in the HPI and PMH.   Patient's Health History    Past Medical History:  Diagnosis Date   Acute cholecystitis due to biliary calculus 12/16/2020   Anticardiolipin antibody positive    Antiphospholipid antibody syndrome (HCC)    Anxiety    Asthma    Clotting disorder (HCC)    Gallstones    Hyponatremia 12/16/2020   Pulmonary embolism [facility])     Past Surgical History:  Procedure Laterality Date   CHOLECYSTECTOMY N/A 12/16/2020   Procedure: LAPAROSCOPIC CHOLECYSTECTOMY;  Surgeon: Rodman Pickle, MD;  Location: [facility] OR;  Service: General;  Laterality: N/A;   WISDOM TOOTH EXTRACTION Bilateral     Family History  Problem Relation Age of Onset   Anemia Mother    Asthma Paternal Aunt    Hypertension Paternal Aunt    Hypertension Paternal Uncle    Heart disease Maternal Grandfather    Hypertension Maternal Grandfather    Stroke Maternal Grandfather    Hypertension Paternal Grandmother    Hypertension Paternal Grandfather    Diabetes Paternal Grandfather    Stroke Paternal Grandfather    Cancer Other    Birth defects Neg Hx     Social History   Socioeconomic History   Marital status: Single    Spouse name: Not on file   Number of children: Not on file   Years of education: Not on file   Highest education  level: Not on file  Occupational History   Not on file  Tobacco Use   Smoking status: Never   Smokeless tobacco: Never  Vaping Use   Vaping Use: Never used  Substance and Sexual Activity   Alcohol use: Not Currently    Comment: not while preg   Drug use: Never   Sexual activity: Yes  Other Topics Concern   Not on file  Social History Narrative   Not on file   Social Determinants of Health   Financial Resource Strain: Not on file  Food Insecurity: Not on file  Transportation Needs: Not on file  Physical Activity: Not on file  Stress: Not on file  Social Connections: Not on file  Intimate Partner Violence: Not on file     Physical Exam   Vitals:   02/21/23 0045 02/21/23 0052  BP: 114/81 114/81  Pulse: 74 74  Resp:  16  Temp:  98.2 F (36.8 C)  SpO2: 100% 100%    CONSTITUTIONAL: Well-appearing, NAD NEURO/PSYCH:  Alert and oriented x 3, normal and symmetric strength and sensation, normal coordination, normal speech EYES:  eyes equal and reactive ENT/NECK:  no LAD, no JVD CARDIO: Regular rate, well-perfused, normal S1 and S2 PULM:  CTAB no wheezing or rhonchi GI/GU:  non-distended, non-tender MSK/SPINE:  No gross deformities, no edema SKIN:  no rash, atraumatic   *  Additional and/or pertinent findings included in MDM below  Diagnostic and Interventional Summary    EKG Interpretation  Date/Time:    Ventricular Rate:    PR Interval:    QRS Duration:   QT Interval:    QTC Calculation:   R Axis:     Text Interpretation:         Labs Reviewed  URINALYSIS, ROUTINE W REFLEX MICROSCOPIC - Abnormal; Notable for the following components:      Result Value   Ketones, ur 5 (*)    All other components within normal limits  POC URINE PREG, ED    No orders to display    Medications - No data to display   Procedures  /  Critical Care Procedures  ED Course and Medical Decision Making  Initial Impression and Ddx Patient classically describes a migraine.   On my evaluation patient explains that her symptoms have resolved.  She was able to take a nap while in the waiting room and now she is headache free.  Denies any recent fever, no numbness or weakness to the arms or legs.  Her neurological exam is completely normal at this time.   Past medical/surgical history that increases complexity of ED encounter: History of antiphospholipid syndrome, clotting disorder, on anticoagulation  Interpretation of Diagnostics Urine sample appears normal  Patient Reassessment and Ultimate Disposition/Management     We discussed the pros and cons of CT imaging at this time.  Given that her symptoms are resolved and her neurological exam is normal I highly doubt intracranial bleeding or any other emergent process.  Appropriate for discharge.  Patient management required discussion with the following services or consulting groups:  None  Complexity of Problems Addressed Acute illness or injury that poses threat of life of bodily function  Additional Data Reviewed and Analyzed Further history obtained from: None  Additional Factors Impacting ED Encounter Risk None  Elmer Sow. Pilar Plate, MD [facility] [email or web address]  Final Clinical Impressions(s) / ED Diagnoses     ICD-10-CM   1. Migraine without aura and without status migrainosus, not intractable  G43.009       ED Discharge Orders          Ordered    Ambulatory referral to Neurology       Comments: An appointment is requested in approximately: 2 weeks   02/21/23 0102             Discharge Instructions Discussed with and Provided to Patient:    Discharge Instructions      You were evaluated in the Emergency Department and after careful evaluation, we did not find any emergent condition requiring admission or further testing in the hospital.  Your exam/testing today is overall reassuring.  Symptoms seem consistent with a migraine.   Recommend follow-up with the neurology headache experts to discuss her symptoms.  Please return to the Emergency Department if you experience any worsening of your condition.   Thank you for allowing [place] to be a part of your care.      Sabas Sous, MD 02/21/23 [phone]

## 2023-02-21 NOTE — Discharge Instructions (Signed)
You were evaluated in the Emergency Department and after careful evaluation, we did not find any emergent condition requiring admission or further testing in the hospital.  Your exam/testing today is overall reassuring.  Symptoms seem consistent with a migraine.  Recommend follow-up with the neurology headache experts to discuss her symptoms.  Please return to the Emergency Department if you experience any worsening of your condition.   Thank you for allowing [place] to be a part of your care.

## 2023-02-24 DIAGNOSIS — Z419 Encounter for procedure for purposes other than remedying health state, unspecified: Secondary | ICD-10-CM | POA: Diagnosis not present

## 2023-03-05 ENCOUNTER — Ambulatory Visit: Payer: Medicaid Other | Admitting: Internal Medicine

## 2023-03-27 DIAGNOSIS — Z419 Encounter for procedure for purposes other than remedying health state, unspecified: Secondary | ICD-10-CM | POA: Diagnosis not present

## 2023-04-16 ENCOUNTER — Ambulatory Visit: Payer: Medicaid Other | Admitting: Neurology

## 2023-04-26 DIAGNOSIS — Z419 Encounter for procedure for purposes other than remedying health state, unspecified: Secondary | ICD-10-CM | POA: Diagnosis not present

## 2023-05-27 DIAGNOSIS — Z419 Encounter for procedure for purposes other than remedying health state, unspecified: Secondary | ICD-10-CM | POA: Diagnosis not present

## 2023-05-28 DIAGNOSIS — N920 Excessive and frequent menstruation with regular cycle: Secondary | ICD-10-CM | POA: Diagnosis not present

## 2023-05-28 DIAGNOSIS — L989 Disorder of the skin and subcutaneous tissue, unspecified: Secondary | ICD-10-CM | POA: Diagnosis not present

## 2023-05-28 DIAGNOSIS — G43111 Migraine with aura, intractable, with status migrainosus: Secondary | ICD-10-CM | POA: Diagnosis not present

## 2023-06-25 DIAGNOSIS — R519 Headache, unspecified: Secondary | ICD-10-CM | POA: Diagnosis not present

## 2023-06-25 DIAGNOSIS — D500 Iron deficiency anemia secondary to blood loss (chronic): Secondary | ICD-10-CM | POA: Diagnosis not present

## 2023-06-25 DIAGNOSIS — D6861 Antiphospholipid syndrome: Secondary | ICD-10-CM | POA: Diagnosis not present

## 2023-06-25 DIAGNOSIS — I2699 Other pulmonary embolism without acute cor pulmonale: Secondary | ICD-10-CM | POA: Diagnosis not present

## 2023-06-27 DIAGNOSIS — Z419 Encounter for procedure for purposes other than remedying health state, unspecified: Secondary | ICD-10-CM | POA: Diagnosis not present

## 2023-07-12 DIAGNOSIS — R220 Localized swelling, mass and lump, head: Secondary | ICD-10-CM | POA: Diagnosis not present

## 2023-07-27 DIAGNOSIS — Z419 Encounter for procedure for purposes other than remedying health state, unspecified: Secondary | ICD-10-CM | POA: Diagnosis not present

## 2023-08-27 DIAGNOSIS — Z419 Encounter for procedure for purposes other than remedying health state, unspecified: Secondary | ICD-10-CM | POA: Diagnosis not present

## 2023-09-26 DIAGNOSIS — Z419 Encounter for procedure for purposes other than remedying health state, unspecified: Secondary | ICD-10-CM | POA: Diagnosis not present

## 2023-10-01 DIAGNOSIS — R519 Headache, unspecified: Secondary | ICD-10-CM | POA: Diagnosis not present

## 2023-10-01 DIAGNOSIS — D6861 Antiphospholipid syndrome: Secondary | ICD-10-CM | POA: Diagnosis not present

## 2023-10-19 DIAGNOSIS — Z86718 Personal history of other venous thrombosis and embolism: Secondary | ICD-10-CM | POA: Diagnosis not present

## 2023-10-19 DIAGNOSIS — R519 Headache, unspecified: Secondary | ICD-10-CM | POA: Diagnosis not present

## 2023-10-19 DIAGNOSIS — D6861 Antiphospholipid syndrome: Secondary | ICD-10-CM | POA: Diagnosis not present

## 2023-10-19 DIAGNOSIS — D500 Iron deficiency anemia secondary to blood loss (chronic): Secondary | ICD-10-CM | POA: Diagnosis not present

## 2023-10-19 DIAGNOSIS — Z86711 Personal history of pulmonary embolism: Secondary | ICD-10-CM | POA: Diagnosis not present

## 2023-10-19 DIAGNOSIS — I2699 Other pulmonary embolism without acute cor pulmonale: Secondary | ICD-10-CM | POA: Diagnosis not present

## 2023-10-27 DIAGNOSIS — Z419 Encounter for procedure for purposes other than remedying health state, unspecified: Secondary | ICD-10-CM | POA: Diagnosis not present

## 2023-11-27 DIAGNOSIS — Z419 Encounter for procedure for purposes other than remedying health state, unspecified: Secondary | ICD-10-CM | POA: Diagnosis not present

## 2023-12-25 DIAGNOSIS — Z419 Encounter for procedure for purposes other than remedying health state, unspecified: Secondary | ICD-10-CM | POA: Diagnosis not present

## 2024-02-05 DIAGNOSIS — Z419 Encounter for procedure for purposes other than remedying health state, unspecified: Secondary | ICD-10-CM | POA: Diagnosis not present

## 2024-02-29 ENCOUNTER — Other Ambulatory Visit (HOSPITAL_COMMUNITY): Payer: Self-pay

## 2024-03-06 DIAGNOSIS — Z419 Encounter for procedure for purposes other than remedying health state, unspecified: Secondary | ICD-10-CM | POA: Diagnosis not present

## 2024-03-23 DIAGNOSIS — H5213 Myopia, bilateral: Secondary | ICD-10-CM | POA: Diagnosis not present

## 2024-04-06 DIAGNOSIS — Z419 Encounter for procedure for purposes other than remedying health state, unspecified: Secondary | ICD-10-CM | POA: Diagnosis not present

## 2024-05-06 DIAGNOSIS — Z419 Encounter for procedure for purposes other than remedying health state, unspecified: Secondary | ICD-10-CM | POA: Diagnosis not present

## 2024-06-06 DIAGNOSIS — Z419 Encounter for procedure for purposes other than remedying health state, unspecified: Secondary | ICD-10-CM | POA: Diagnosis not present

## 2024-07-07 DIAGNOSIS — Z419 Encounter for procedure for purposes other than remedying health state, unspecified: Secondary | ICD-10-CM | POA: Diagnosis not present
